# Patient Record
Sex: Female | Born: 1955
Health system: Southern US, Community
[De-identification: ages and names within clinical notes are randomized; demographics above are authoritative.]

## PROBLEM LIST (undated history)

## (undated) DIAGNOSIS — B019 Varicella without complication: Secondary | ICD-10-CM

## (undated) HISTORY — DX: Varicella without complication: B01.9

## (undated) HISTORY — PX: BUNIONECTOMY: SHX129

---

## 1999-07-02 ENCOUNTER — Other Ambulatory Visit: Admission: RE | Admit: 1999-07-02 | Discharge: 1999-07-02 | Payer: Self-pay | Admitting: Internal Medicine

## 2000-03-11 ENCOUNTER — Other Ambulatory Visit: Admission: RE | Admit: 2000-03-11 | Discharge: 2000-03-11 | Payer: Self-pay | Admitting: Podiatry

## 2001-01-16 ENCOUNTER — Encounter: Admission: RE | Admit: 2001-01-16 | Discharge: 2001-01-16 | Payer: Self-pay | Admitting: Internal Medicine

## 2001-01-16 ENCOUNTER — Encounter: Payer: Self-pay | Admitting: Internal Medicine

## 2001-12-08 ENCOUNTER — Other Ambulatory Visit: Admission: RE | Admit: 2001-12-08 | Discharge: 2001-12-08 | Payer: Self-pay | Admitting: Internal Medicine

## 2002-03-24 ENCOUNTER — Encounter: Admission: RE | Admit: 2002-03-24 | Discharge: 2002-03-24 | Payer: Self-pay | Admitting: Internal Medicine

## 2002-03-24 ENCOUNTER — Encounter: Payer: Self-pay | Admitting: Internal Medicine

## 2003-05-13 ENCOUNTER — Encounter: Payer: Self-pay | Admitting: Internal Medicine

## 2003-05-13 ENCOUNTER — Encounter: Admission: RE | Admit: 2003-05-13 | Discharge: 2003-05-13 | Payer: Self-pay | Admitting: Internal Medicine

## 2004-04-24 ENCOUNTER — Other Ambulatory Visit: Admission: RE | Admit: 2004-04-24 | Discharge: 2004-04-24 | Payer: Self-pay | Admitting: Internal Medicine

## 2004-05-18 ENCOUNTER — Encounter: Admission: RE | Admit: 2004-05-18 | Discharge: 2004-05-18 | Payer: Self-pay | Admitting: Internal Medicine

## 2005-05-09 ENCOUNTER — Ambulatory Visit: Payer: Self-pay | Admitting: Family Medicine

## 2005-05-22 ENCOUNTER — Encounter: Admission: RE | Admit: 2005-05-22 | Discharge: 2005-05-22 | Payer: Self-pay | Admitting: Internal Medicine

## 2005-05-23 ENCOUNTER — Ambulatory Visit: Payer: Self-pay | Admitting: Internal Medicine

## 2005-10-24 ENCOUNTER — Ambulatory Visit: Payer: Self-pay | Admitting: Internal Medicine

## 2006-05-30 ENCOUNTER — Encounter: Admission: RE | Admit: 2006-05-30 | Discharge: 2006-05-30 | Payer: Self-pay | Admitting: Internal Medicine

## 2006-07-17 ENCOUNTER — Ambulatory Visit: Payer: Self-pay | Admitting: Internal Medicine

## 2006-10-31 ENCOUNTER — Ambulatory Visit: Payer: Self-pay | Admitting: Internal Medicine

## 2006-12-17 ENCOUNTER — Ambulatory Visit: Payer: Self-pay | Admitting: Internal Medicine

## 2006-12-17 LAB — CONVERTED CEMR LAB
AST: 16 units/L (ref 0–37)
Albumin: 3.6 g/dL (ref 3.5–5.2)
Basophils Absolute: 0 10*3/uL (ref 0.0–0.1)
Basophils Relative: 0.1 % (ref 0.0–1.0)
Chol/HDL Ratio, serum: 2.1
Creatinine, Ser: 1.1 mg/dL (ref 0.4–1.2)
Eosinophil percent: 0.7 % (ref 0.0–5.0)
Glucose, Bld: 89 mg/dL (ref 70–99)
HCT: 43.8 % (ref 36.0–46.0)
Hemoglobin: 15.4 g/dL — ABNORMAL HIGH (ref 12.0–15.0)
LDL Cholesterol: 73 mg/dL (ref 0–99)
Lymphocytes Relative: 26.2 % (ref 12.0–46.0)
Monocytes Relative: 4.8 % (ref 3.0–11.0)
Neutrophils Relative %: 68.2 % (ref 43.0–77.0)
Platelets: 211 10*3/uL (ref 150–400)
Potassium: 4 meq/L (ref 3.5–5.1)
RDW: 11.6 % (ref 11.5–14.6)
Total Protein: 6.7 g/dL (ref 6.0–8.3)
VLDL: 26 mg/dL (ref 0–40)
WBC: 7.9 10*3/uL (ref 4.5–10.5)

## 2007-02-18 ENCOUNTER — Ambulatory Visit: Payer: Self-pay | Admitting: Internal Medicine

## 2007-02-18 ENCOUNTER — Other Ambulatory Visit: Admission: RE | Admit: 2007-02-18 | Discharge: 2007-02-18 | Payer: Self-pay | Admitting: Internal Medicine

## 2007-07-08 ENCOUNTER — Ambulatory Visit (HOSPITAL_COMMUNITY): Admission: RE | Admit: 2007-07-08 | Discharge: 2007-07-08 | Payer: Self-pay | Admitting: Internal Medicine

## 2007-09-15 ENCOUNTER — Telehealth: Payer: Self-pay | Admitting: Internal Medicine

## 2007-10-12 ENCOUNTER — Ambulatory Visit: Payer: Self-pay | Admitting: Internal Medicine

## 2008-03-08 ENCOUNTER — Telehealth: Payer: Self-pay | Admitting: Internal Medicine

## 2008-07-14 ENCOUNTER — Encounter: Payer: Self-pay | Admitting: Internal Medicine

## 2008-07-15 ENCOUNTER — Ambulatory Visit (HOSPITAL_COMMUNITY): Admission: RE | Admit: 2008-07-15 | Discharge: 2008-07-15 | Payer: Self-pay | Admitting: Internal Medicine

## 2008-09-27 ENCOUNTER — Ambulatory Visit: Payer: Self-pay | Admitting: Internal Medicine

## 2008-11-29 ENCOUNTER — Telehealth: Payer: Self-pay | Admitting: Internal Medicine

## 2008-12-20 ENCOUNTER — Ambulatory Visit: Payer: Self-pay | Admitting: Internal Medicine

## 2008-12-30 ENCOUNTER — Ambulatory Visit: Payer: Self-pay | Admitting: Internal Medicine

## 2008-12-30 LAB — HM COLONOSCOPY

## 2009-07-31 ENCOUNTER — Encounter: Admission: RE | Admit: 2009-07-31 | Discharge: 2009-07-31 | Payer: Self-pay | Admitting: Internal Medicine

## 2009-08-04 ENCOUNTER — Encounter: Payer: Self-pay | Admitting: Internal Medicine

## 2009-08-04 ENCOUNTER — Encounter: Admission: RE | Admit: 2009-08-04 | Discharge: 2009-08-04 | Payer: Self-pay | Admitting: Internal Medicine

## 2009-10-16 ENCOUNTER — Ambulatory Visit: Payer: Self-pay | Admitting: Internal Medicine

## 2010-03-23 HISTORY — PX: OTHER SURGICAL HISTORY: SHX169

## 2010-07-03 ENCOUNTER — Telehealth: Payer: Self-pay | Admitting: Internal Medicine

## 2010-08-13 ENCOUNTER — Encounter: Admission: RE | Admit: 2010-08-13 | Discharge: 2010-08-13 | Payer: Self-pay | Admitting: Internal Medicine

## 2010-08-22 ENCOUNTER — Ambulatory Visit: Payer: Self-pay | Admitting: Internal Medicine

## 2010-08-22 LAB — CONVERTED CEMR LAB
ALT: 20 units/L (ref 0–35)
Alkaline Phosphatase: 62 units/L (ref 39–117)
Bilirubin, Direct: 0.3 mg/dL (ref 0.0–0.3)
Blood in Urine, dipstick: NEGATIVE
Calcium: 9.8 mg/dL (ref 8.4–10.5)
Creatinine, Ser: 0.9 mg/dL (ref 0.4–1.2)
Eosinophils Absolute: 0 10*3/uL (ref 0.0–0.7)
Glucose, Bld: 84 mg/dL (ref 70–99)
HCT: 43.9 % (ref 36.0–46.0)
Lymphs Abs: 1.6 10*3/uL (ref 0.7–4.0)
MCV: 90.5 fL (ref 78.0–100.0)
Monocytes Absolute: 0.3 10*3/uL (ref 0.1–1.0)
Neutro Abs: 4.1 10*3/uL (ref 1.4–7.7)
Platelets: 186 10*3/uL (ref 150.0–400.0)
Protein, U semiquant: NEGATIVE
Sodium: 140 meq/L (ref 135–145)
T3, Free: 3.6 pg/mL (ref 2.3–4.2)
TSH: 0.03 microintl units/mL — ABNORMAL LOW (ref 0.35–5.50)
Total Bilirubin: 1.9 mg/dL — ABNORMAL HIGH (ref 0.3–1.2)
pH: 6

## 2010-08-29 ENCOUNTER — Other Ambulatory Visit: Admission: RE | Admit: 2010-08-29 | Discharge: 2010-08-29 | Payer: Self-pay | Admitting: Internal Medicine

## 2010-08-29 ENCOUNTER — Ambulatory Visit: Payer: Self-pay | Admitting: Internal Medicine

## 2010-08-29 DIAGNOSIS — E079 Disorder of thyroid, unspecified: Secondary | ICD-10-CM | POA: Insufficient documentation

## 2010-08-29 DIAGNOSIS — J309 Allergic rhinitis, unspecified: Secondary | ICD-10-CM | POA: Insufficient documentation

## 2010-08-29 LAB — HM PAP SMEAR

## 2010-08-30 LAB — CONVERTED CEMR LAB
Free T4: 0.86 ng/dL (ref 0.60–1.60)
TSH: 0.11 microintl units/mL — ABNORMAL LOW (ref 0.35–5.50)

## 2010-09-14 ENCOUNTER — Ambulatory Visit: Payer: Self-pay | Admitting: Endocrinology

## 2010-09-14 DIAGNOSIS — E069 Thyroiditis, unspecified: Secondary | ICD-10-CM

## 2010-09-14 DIAGNOSIS — N951 Menopausal and female climacteric states: Secondary | ICD-10-CM

## 2010-09-18 ENCOUNTER — Telehealth: Payer: Self-pay | Admitting: Endocrinology

## 2010-10-05 ENCOUNTER — Ambulatory Visit: Payer: Self-pay | Admitting: Endocrinology

## 2010-10-05 LAB — CONVERTED CEMR LAB
Free T4: 0.78 ng/dL (ref 0.60–1.60)
TSH: 2.4 microintl units/mL (ref 0.35–5.50)

## 2010-11-05 ENCOUNTER — Ambulatory Visit: Payer: Self-pay | Admitting: Endocrinology

## 2010-11-05 ENCOUNTER — Telehealth: Payer: Self-pay | Admitting: Endocrinology

## 2011-01-04 ENCOUNTER — Other Ambulatory Visit: Payer: Self-pay | Admitting: Endocrinology

## 2011-01-04 ENCOUNTER — Ambulatory Visit
Admission: RE | Admit: 2011-01-04 | Discharge: 2011-01-04 | Payer: Self-pay | Source: Home / Self Care | Attending: Endocrinology | Admitting: Endocrinology

## 2011-01-04 LAB — TSH: TSH: 1.3 u[IU]/mL (ref 0.35–5.50)

## 2011-01-14 ENCOUNTER — Encounter: Payer: Self-pay | Admitting: Internal Medicine

## 2011-01-24 NOTE — Assessment & Plan Note (Signed)
Summary: 2 month follow up-lb   Vital Signs:  Patient profile:   55 year old female Menstrual status:  perimenopausal Height:      67 inches (170.18 cm) Weight:      141.75 pounds (64.43 kg) BMI:     22.28 O2 Sat:      99 % on Room air Temp:     97.9 degrees F (36.61 degrees C) oral Pulse rate:   80 / minute BP sitting:   112 / 72  (left arm) Cuff size:   regular  Vitals Entered By: Brenton Grills CMA Duncan Dull) (January 04, 2011 11:17 AM)  O2 Flow:  Room air CC: F/U on thyroid/aj Is Patient Diabetic? No   CC:  F/U on thyroid/aj.  History of Present Illness: pt is her for f/u of subacute thyroiditis.  she reports sxs of insomnia, and intermittent excessive diaphoresis.   she would like to go off synthroid if possible.  Current Medications (verified): 1)  Ambien 10 Mg  Tabs (Zolpidem Tartrate) .Marland Kitchen.. 1 By Mouth At Bedtime 2)  Levothyroxine Sodium 50 Mcg Tabs (Levothyroxine Sodium) .Marland Kitchen.. 1 Tab Once Daily  Allergies (verified): No Known Drug Allergies  Past History:  Past Medical History: Last updated: 08/29/2010 G0P0  Allergic rhinitis seasonal summer cough  takes claritin  Varicella   Review of Systems  The patient denies fever.    Physical Exam  General:  normal appearance.   Neck:  thyroid is normal Skin:  not diaphoretic Additional Exam:   FastTSH                   1.30 uIU/mL     Impression & Recommendations:  Problem # 1:  THYROIDITIS (ICD-245.9) ? need for ongoing synthroid  Other Orders: TLB-TSH (Thyroid Stimulating Hormone) (84443-TSH) Est. Patient Level III (04540)  Patient Instructions: 1)  blood tests are being ordered for you today.  please call [phone] to hear your test results. 2)  pending the test results, please continue the same medications for now. 3)  (update: i left message on phone-tree:  stop synthroid.  go to lab in 6 weeks 245.9)   Orders Added: 1)  TLB-TSH (Thyroid Stimulating Hormone) [84443-TSH] 2)  Est. Patient Level III  [78295]

## 2011-01-24 NOTE — Miscellaneous (Signed)
Summary: Flu Vaccination/[facility]   Flu Vaccination/[facility]   Imported By: Maryln Gottron 05/17/2010 10:06:05  _____________________________________________________________________  External Attachment:    Type:   Image     Comment:   External Document

## 2011-01-24 NOTE — Assessment & Plan Note (Signed)
Summary: cpx/ssc   Vital Signs:  Patient profile:   55 year old female Menstrual status:  perimenopausal LMP:     05/07/2010 Height:      67 inches Weight:      140 pounds BMI:     22.01 Pulse rate:   78 / minute BP sitting:   100 / 60  (left arm) Cuff size:   regular  Vitals Entered By: Romualdo Bolk, CMA (AAMA) (August 29, 2010 10:53 AM) CC: CPX with pap LMP (date): 05/07/2010     Menstrual Status perimenopausal Enter LMP: 05/07/2010   History of Present Illness: Christina Wilkins comes in today for a checkup. Her last office visit with [place] was  over three years ago.  although has been here when her kids have a checkup.she has no major concerns but is due for a checkup. She has no main complaints today. She uses Ambien as needed when she travels a prescription of 30th Lasted  three years. She is no longer on birth control pills. She is menopausal and has had six months without a. And then intermittent. She has hot flushes a good bit.  No major injuries or other change in her health status  Preventive Care Screening  Last Tetanus Booster:    Date:  12/23/2006    Results:  Tdap   Prior Values:    Mammogram:  ASSESSMENT: Negative - BI-RADS 1^MM DIGITAL SCREENING (08/13/2010)    Colonoscopy:  Location:  [facility].   (12/30/2008)   Preventive Screening-Counseling & Management  Alcohol-Tobacco     Alcohol drinks/day: <1     Alcohol type: beer     Smoking Status: never  Caffeine-Diet-Exercise     Caffeine use/day: less than 1 a day     Does Patient Exercise: yes     Times/week: 4  Hep-HIV-STD-Contraception     Dental Visit-last 6 months yes     Sun Exposure-Excessive: no  Safety-Violence-Falls     Seat Belt Use: yes     Firearms in the Home: no firearms in the home     Smoke Detectors: yes      Drug Use:  no.        Blood Transfusions:  no.    Current Medications (verified): 1)  Ambien 10 Mg  Tabs (Zolpidem Tartrate) .Marland Kitchen.. 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 Alcohol use-no Drug use-no Regular exercise-yes HH of 3  two adopted childrenSmoking Status:  never Caffeine use/day:  less than 1 a day Does Patient Exercise:  yes Drug Use:  no Seat Belt Use:  yes Dental Care w/in 6 mos.:  yes Sun Exposure-Excessive:  no Blood Transfusions:  no  Review of Systems  The patient denies anorexia, fever, weight loss, weight gain, vision loss, decreased hearing, hoarseness, chest pain, syncope, dyspnea on exertion, peripheral edema, prolonged cough, headaches, hemoptysis, abdominal pain, melena, hematochezia, severe indigestion/heartburn, hematuria, incontinence, muscle weakness, suspicious skin lesions, depression, abnormal bleeding, enlarged lymph nodes, angioedema, and breast masses.    Physical Exam  General:  Well-developed,well-nourished,in no  acute distress; alert,appropriate and cooperative throughout examination Head:  normocephalic and atraumatic.   Eyes:  PERRL, EOMs full, conjunctiva clear  Ears:  R ear normal, L ear normal, and no external deformities.   Nose:  no external deformity, no external erythema, and no nasal discharge.   Mouth:  good dentition and pharynx pink and moist.   Neck:  No deformities, masses, or tenderness noted. i dont feel thyroid  Chest Wall:  No deformities, masses, or tenderness noted. Breasts:  No mass, nodules, thickening, tenderness, bulging, retraction, inflamation, nipple discharge or skin changes noted.   Lungs:   Normal respiratory effort, chest expands symmetrically. Lungs are clear to auscultation, no crackles or wheezes.no dullness.   Heart:  Normal rate and regular rhythm. S1 and S2 normal without gallop, murmur, click, rub or other extra sounds.no lifts.   Abdomen:  Bowel sounds positive,abdomen soft and non-tender without masses, organomegaly or hernias noted. Rectal:  No external abnormalities noted. Normal sphincter tone. No rectal masses or tenderness. Genitalia:  Pelvic Exam:        External: normal female genitalia without lesions or masses        Vagina: normal without lesions or masses        Cervix: normal without lesions or masses        Adnexa: normal bimanual exam without masses or fullness        Uterus: normal by palpation        Pap smear: performed Msk:  left foot  small cystic soft lesion  left foot. dorsum non tender ( area of injury ) normal appearing function and range of motion afoot Pulses:  R and L carotid,radial,femoral,dorsalis pedis and posterior tibial pulses are full and equal bilaterally Extremities:  no clubbing cyanosis or edema  Neurologic:  alert & oriented X3, cranial nerves II-XII intact, strength normal in all extremities, gait normal, and DTRs symmetrical and normal.   Skin:  turgor normal, color normal, and no purpura.  sun changes  Cervical Nodes:  No lymphadenopathy noted Axillary Nodes:  No palpable lymphadenopathy Inguinal Nodes:  No significant adenopathy Psych:  Oriented X3, normally interactive, good eye contact, not anxious appearing, and not depressed appearing.   laboratory studies reviewed with patient. TSH decreased  Impression & Recommendations:  Problem # 1:  PREVENTIVE HEALTH CARE (ICD-V70.0) continue healthy lifestyle   Problem # 2:  ROUTINE GYNECOLOGICAL EXAM (ICD-V72.31)  normal exam and Pap done  Orders: Pap Smear, Thin Prep ( Collection of) (Z6109)  Problem # 3:  THYROID STIMULATING HORMONE, ABNORMAL (ICD-246.9) seemingly no  symptoms related to this. Discussed options will repeat this today and if confirmed consistent with subclinical hyperthyroidism and will plan endocrine consult. She will probably need to scan etc. discussed with her. Orders: TLB-TSH (Thyroid Stimulating Hormone) (84443-TSH) TLB-T4 (Thyrox), Free [id number]) Venipuncture (19147) Specimen Handling (82956)  Problem # 4:  ALLERGIC RHINITIS (ICD-477.9) as needed over the counter medication  Problem # 5:  situational sleep disorder can call for a refill of her Ambien is needed.  Problem # 6:  perimenopausal state counseled  Complete Medication List: 1)  Ambien 10 Mg Tabs (Zolpidem tartrate) .Marland Kitchen.. 1 by mouth at bedtime  Other Orders: Admin 1st Vaccine (21308) Flu Vaccine 57yrs + (65784)  Patient Instructions: 1)  You will be informed of lab results when available.  2)  if confirmed abnormal will do endocrine consult.    Flu Vaccine Consent Questions     Do you have a history of severe  allergic reactions to this vaccine? no    Any prior history of allergic reactions to egg and/or gelatin? no    Do you have a sensitivity to the preservative Thimersol? no    Do you have a past history of Guillan-Barre Syndrome? no    Do you currently have an acute febrile illness? no    Have you ever had a severe reaction to latex? no    Vaccine information given and explained to patient? yes    Are you currently pregnant? no    Lot Number:[id number]   Exp Date:06/22/2011   Site Given  Left Deltoid IMlu Romualdo Bolk, CMA (AAMA)  August 29, 2010 10:59 AM

## 2011-01-24 NOTE — Assessment & Plan Note (Signed)
Summary: f/u appt/#/cd   Vital Signs:  Patient profile:   55 year old female Menstrual status:  perimenopausal Height:      67 inches (170.18 cm) Weight:      145.50 pounds (66.14 kg) BMI:     22.87 O2 Sat:      97 % on Room air Temp:     97.7 degrees F (36.50 degrees C) oral Pulse rate:   76 / minute BP sitting:   118 / 72  (left arm) Cuff size:   regular  Vitals Entered By: Brenton Grills MA (October 05, 2010 3:52 PM)  O2 Flow:  Room air CC: Follow-up visit/aj Is Patient Diabetic? No   CC:  Follow-up visit/aj.  History of Present Illness: pt was recently found to have subacute thyroiditis.  she was started on synthroid when tsh was high-normal.  on synthroid, she feels no different.    Current Medications (verified): 1)  Ambien 10 Mg  Tabs (Zolpidem Tartrate) .Marland Kitchen.. 1 By Mouth At Bedtime 2)  Levothyroxine Sodium 50 Mcg Tabs (Levothyroxine Sodium) .Marland Kitchen.. 1 Tab Once Daily  Allergies (verified): No Known Drug Allergies  Past History:  Past Medical History: Last updated: 08/29/2010 G0P0  Allergic rhinitis seasonal summer cough  takes claritin  Varicella   Review of Systems  The patient denies fever.    Physical Exam  General:  normal appearance.   Neck:  thyroid is normal Additional Exam:  FastTSH                   2.40 uIU/mL                 0.35-5.50 Free T4                   0.78 ng/dL                  0.60-1.60    Impression & Recommendations:  Problem # 1:  THYROIDITIS (ICD-245.9) now euthyroid, on synthroid  Other Orders: TLB-TSH (Thyroid Stimulating Hormone) (84443-TSH) TLB-T4 (Thyrox), Free [phone]) Est. Patient Level III (19147)  Patient Instructions: 1)  blood tests are being ordered for you today.  please call [phone] to hear your test results. 2)  pending the test results, please continue the same medications for now. 3)  tentative plan will be to adjust synthroid based on the result, and go back to lab in 1 month for recheck of tsh  245.9.   Orders Added: 1)  TLB-TSH (Thyroid Stimulating Hormone) [84443-TSH] 2)  TLB-T4 (Thyrox), Free [30865-HQ4O] 3)  Est. Patient Level III [96295]

## 2011-01-24 NOTE — Progress Notes (Signed)
Summary: refill on ambien  Phone Note Call from Patient   Caller: Patient Summary of Call: Pt would like a rx called in for Ambien.  Initial call taken by: Romualdo Bolk, CMA (AAMA),  July 03, 2010 2:06 PM  Follow-up for Phone Call        Per Dr. Fabian Sharp- Okay to do #30 with 1 refill. Rx called in. Follow-up by: Romualdo Bolk, CMA (AAMA),  July 03, 2010 2:10 PM    New/Updated Medications: AMBIEN 10 MG  TABS (ZOLPIDEM TARTRATE) 1 by mouth at bedtime Prescriptions: AMBIEN 10 MG  TABS (ZOLPIDEM TARTRATE) 1 by mouth at bedtime  #30 x 1   Entered by:   Romualdo Bolk, CMA (AAMA)   Authorized by:   Madelin Headings MD   Signed by:   Romualdo Bolk, CMA (AAMA) on 07/03/2010   Method used:   Telephoned to ...       [facility] [address] (retail)       [address]       [place], [place]  [address]       Ph: [phone] or [phone]       Fax: [phone]   RxID:   [id number]

## 2011-01-24 NOTE — Assessment & Plan Note (Signed)
Summary: new/uhc/low tsh/panosh/cd   Vital Signs:  Patient profile:   55 year old female Menstrual status:  perimenopausal Height:      67 inches (170.18 cm) Weight:      142 pounds (64.55 kg) BMI:     22.32 O2 Sat:      95 % on Room air Temp:     97.4 degrees F (36.33 degrees C) oral Pulse rate:   65 / minute BP sitting:   108 / 70  (left arm) Cuff size:   regular  Vitals Entered By: Brenton Grills MA (September 14, 2010 8:42 AM)  O2 Flow:  Room air CC: New Endo/Low TSH/Dr. Panosh/aj   CC:  New Endo/Low TSH/Dr. Panosh/aj.  History of Present Illness: pt was noted on recent routine labs to have suppressed tsh.  pt states she feels well in general.  she has few weeks of mild "hot flashes" of the skin, and slight assoc diaphoresis.  Current Medications (verified): 1)  Ambien 10 Mg  Tabs (Zolpidem Tartrate) .Marland Kitchen.. 1 By Mouth At Bedtime  Allergies (verified): No Known Drug Allergies  Past History:  Past Medical History: Last updated: 08/29/2010 G0P0  Allergic rhinitis seasonal summer cough  takes claritin  Varicella   Family History: Reviewed history from 08/29/2010 and no changes required. 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   Physical Exam  General:  normal appearance.   Head:  head: no deformity eyes: no periorbital swelling, no proptosis external nose and ears are normal mouth: no lesion seen Neck:  thyroid is not enlarged.  there is a slightly irreg surface Lungs:  Clear to  auscultation bilaterally. Normal respiratory effort.  Heart:  Regular rate and rhythm without murmurs or gallops noted. Normal S1,S2.   Msk:  muscle bulk and strength are grossly normal.  no obvious joint swelling.  gait is normal and steady  Pulses:  dorsalis pedis intact bilat.   Extremities:  no deformity.  no ulcer on the feet.  feet are of normal color and temp.  no edema  Neurologic:  cn 2-12 grossly intact.   readily moves all 4's.   sensation is intact to touch on the feet  Skin:  normal texture and temp.  no rash.  not diaphoretic  Cervical Nodes:  No significant adenopathy.  Psych:  Alert and cooperative; normal mood and affect; normal attention span and concentration.   Additional Exam:  outside test results are reviewed:  2007: Thyroid Stimulating Hormone (TSH)  1.31   2011:  FastTSH              [L]  0.11 uIU/mL        Impression & Recommendations:  Problem # 1:  THYROIDITIS (ICD-245.9) subacute  Problem # 2:  THYROID STIMULATING HORMONE, ABNORMAL (ICD-246.9) she is entering the hypothyroid phase now  Problem # 3:  MENOPAUSAL SYNDROME (ICD-627.2) mild, but this could limit interpretation of her sxs  Medications Added to Medication List This Visit: 1)  Levothyroxine Sodium 50 Mcg Tabs (Levothyroxine sodium) .Marland Kitchen.. 1 tab once daily  Other Orders: TLB-TSH (Thyroid Stimulating Hormone) (84443-TSH) TLB-T4 (  Thyrox), Free [phone]) Consultation Level IV [phone])  Patient Instructions: 1)  recheck thyroid blood tests today. 2)  please call [phone] to hear your test results. 3)  if still high, let's a thyroid nuclear med scan 4)  if lower, we would conclude that you have subacute (viral) thyroiditis, which will go through a low-thyroid-blood-test phase before recovery. 5)  (update: i left message on phone-tree:  of the above possibilities, this is the latter one.  start synthroid 50/day.  ret 3 weeks). Prescriptions: LEVOTHYROXINE SODIUM 50 MCG TABS (LEVOTHYROXINE  SODIUM) 1 tab once daily  #30 x 1   Entered and Authorized by:   Minus Breeding MD   Signed by:   Minus Breeding MD on 09/14/2010   Method used:   [facility]ronically to        [phone] (retail)       [address]       [place], [place]  [address]       Ph: [phone] or [phone]       Fax: [phone]   RxID:   [id number]    Preventive Care Screening  Pap Smear:    Date:  08/23/2010    Results:  normal   Mammogram:    Date:  07/23/2010    Results:  normal

## 2011-01-24 NOTE — Progress Notes (Signed)
  Phone Note Call from Patient Call back at Work Phone [phone]   Caller: Patient Call For: ellison Summary of Call: Pt states her lab results are not on phone tree and also wants clarifiation on prescription?  Initial call taken by: Verdell Face,  September 18, 2010 3:41 PM  Follow-up for Phone Call        I checked PT and message was not available for this pt, please advise Follow-up by: Margaret Pyle, CMA,  September 18, 2010 3:57 PM  Additional Follow-up for Phone Call Additional follow up Details #1::        i called pt and explained.  she agrees. Additional Follow-up by: Minus Breeding MD,  September 18, 2010 5:17 PM

## 2011-01-24 NOTE — Progress Notes (Signed)
Summary: med question/refill  Phone Note Call from Patient Call back at Home Phone [phone]   Caller: Patient Reason for Call: Talk to Nurse Summary of Call: Wants to know if she needs to still be on  Levothyroxine Sodium 50 Mcg Tabs, if so [facility] [address], pt did do her labs today Initial call taken by: Migdalia Dk,  November 05, 2010 10:06 AM  Follow-up for Phone Call        i'll send rx based on the resuts of today's blood test results, because i don't want to send the wrong strength.  we'll know today. Follow-up by: Minus Breeding MD,  November 05, 2010 10:40 AM  Additional Follow-up for Phone Call Additional follow up Details #1::        Pt adivsed via VM, told to check pharmacy later today or tomorrow for Rx Additional Follow-up by: Margaret Pyle, CMA,  November 05, 2010 11:11 AM

## 2011-02-18 ENCOUNTER — Other Ambulatory Visit: Payer: Self-pay

## 2011-02-18 ENCOUNTER — Telehealth: Payer: Self-pay | Admitting: Endocrinology

## 2011-02-22 ENCOUNTER — Other Ambulatory Visit: Payer: 59

## 2011-02-22 ENCOUNTER — Encounter (INDEPENDENT_AMBULATORY_CARE_PROVIDER_SITE_OTHER): Payer: Self-pay | Admitting: *Deleted

## 2011-02-22 ENCOUNTER — Other Ambulatory Visit: Payer: Self-pay | Admitting: Endocrinology

## 2011-02-22 DIAGNOSIS — E069 Thyroiditis, unspecified: Secondary | ICD-10-CM

## 2011-02-28 NOTE — Progress Notes (Signed)
Summary: MED QUESTION  Phone Note Call from Patient Call back at Home Phone [phone]   Caller: Patient Reason for Call: Talk to Nurse Summary of Call: PT WANTS TO TALK ABOUT GOING BACK ON SYNTHROID Initial call taken by: Migdalia Dk,  February 18, 2011 9:47 AM  Follow-up for Phone Call        i would be happy to consider that.  please redo the blood test, though.  i wll be happy to leave message on phone-tree about the result. Follow-up by: Minus Breeding MD,  February 18, 2011 9:52 AM  Additional Follow-up for Phone Call Additional follow up Details #1::        pt did not do blood test today due to 45 minute wait in the lab, Migdalia Dk  February 18, 2011 10:05 AM   Pt stated that she will come back to the lab Friday  Additional Follow-up by: Margaret Pyle, CMA,  February 18, 2011 3:29 PM

## 2011-07-12 ENCOUNTER — Other Ambulatory Visit: Payer: Self-pay | Admitting: Internal Medicine

## 2011-07-12 DIAGNOSIS — Z1231 Encounter for screening mammogram for malignant neoplasm of breast: Secondary | ICD-10-CM

## 2011-08-19 ENCOUNTER — Ambulatory Visit
Admission: RE | Admit: 2011-08-19 | Discharge: 2011-08-19 | Disposition: A | Discharge status: Home / Self Care | Payer: 59 | Source: Ambulatory Visit | Attending: Internal Medicine | Admitting: Internal Medicine

## 2011-08-19 DIAGNOSIS — Z1231 Encounter for screening mammogram for malignant neoplasm of breast: Secondary | ICD-10-CM

## 2011-10-18 ENCOUNTER — Ambulatory Visit: Payer: 59

## 2011-10-18 ENCOUNTER — Ambulatory Visit (INDEPENDENT_AMBULATORY_CARE_PROVIDER_SITE_OTHER): Payer: 59

## 2011-10-18 DIAGNOSIS — Z23 Encounter for immunization: Secondary | ICD-10-CM

## 2012-03-11 ENCOUNTER — Telehealth: Payer: Self-pay | Admitting: *Deleted

## 2012-03-11 DIAGNOSIS — E069 Thyroiditis, unspecified: Secondary | ICD-10-CM

## 2012-03-11 DIAGNOSIS — E079 Disorder of thyroid, unspecified: Secondary | ICD-10-CM

## 2012-03-11 DIAGNOSIS — Z0000 Encounter for general adult medical examination without abnormal findings: Secondary | ICD-10-CM

## 2012-03-11 NOTE — Telephone Encounter (Signed)
Yes   tsh Free t3 free t4  Thyroid antibodies   Then OV with results   Would also advise  to do a full panel  CMP and cbc and lipids  To be complete  Before she comes  As i dont see these checked recently .

## 2012-03-11 NOTE — Telephone Encounter (Signed)
Pt aware and will call back to schedule the lab appt and rov

## 2012-03-11 NOTE — Telephone Encounter (Signed)
Pt emailed and wanted to know if it - would it be possible to have a thyroid test run again - I am experiencing the same symptoms as last year - constant temp fluctuations, sleep disturbances - same thing as when I had the viral thryoiditis.  My questions is this - can I go ahead and have the lab drawn and THEN come in for an appt or do I have to come in first, then return again?

## 2012-03-24 ENCOUNTER — Other Ambulatory Visit (INDEPENDENT_AMBULATORY_CARE_PROVIDER_SITE_OTHER): Payer: 59

## 2012-03-24 DIAGNOSIS — Z0000 Encounter for general adult medical examination without abnormal findings: Secondary | ICD-10-CM

## 2012-03-24 DIAGNOSIS — E079 Disorder of thyroid, unspecified: Secondary | ICD-10-CM

## 2012-03-24 DIAGNOSIS — E069 Thyroiditis, unspecified: Secondary | ICD-10-CM

## 2012-03-24 LAB — COMPREHENSIVE METABOLIC PANEL
Albumin: 4.1 g/dL (ref 3.5–5.2)
Alkaline Phosphatase: 58 U/L (ref 39–117)
BUN: 17 mg/dL (ref 6–23)
Calcium: 9.4 mg/dL (ref 8.4–10.5)
Creatinine, Ser: 1 mg/dL (ref 0.4–1.2)
GFR: 65.2 mL/min (ref >60.00)
Glucose, Bld: 89 mg/dL (ref 70–99)
Total Protein: 6.8 g/dL (ref 6.0–8.3)

## 2012-03-24 LAB — CBC WITH DIFFERENTIAL/PLATELET
Eosinophils Relative: 1.3 % (ref 0.0–5.0)
Lymphocytes Relative: 39.2 % (ref 12.0–46.0)
Lymphs Abs: 1.5 10*3/uL (ref 0.7–4.0)
MCHC: 33.5 g/dL (ref 30.0–36.0)
Monocytes Absolute: 0.2 10*3/uL (ref 0.1–1.0)
Neutro Abs: 2.1 10*3/uL (ref 1.4–7.7)
Platelets: 155 10*3/uL (ref 150.0–400.0)
WBC: 3.9 10*3/uL — ABNORMAL LOW (ref 4.5–10.5)

## 2012-03-24 LAB — LIPID PANEL: Triglycerides: 45 mg/dL (ref 0.0–149.0)

## 2012-03-24 LAB — TSH: TSH: 2.33 u[IU]/mL (ref 0.35–5.50)

## 2012-03-25 LAB — THYROID ANTIBODIES
Thyroglobulin Ab: <20 U/mL (ref <40.0)
Thyroperoxidase Ab SerPl-aCnc: <10 IU/mL (ref <35.0)

## 2012-04-07 ENCOUNTER — Encounter: Payer: Self-pay | Admitting: Internal Medicine

## 2012-04-07 ENCOUNTER — Ambulatory Visit (INDEPENDENT_AMBULATORY_CARE_PROVIDER_SITE_OTHER): Payer: 59 | Admitting: Internal Medicine

## 2012-04-07 VITALS — BP 120/80 | HR 99 | Temp 98.3°F | Wt 141.5 lb

## 2012-04-07 DIAGNOSIS — Z8669 Personal history of other diseases of the nervous system and sense organs: Secondary | ICD-10-CM

## 2012-04-07 DIAGNOSIS — R232 Flushing: Secondary | ICD-10-CM

## 2012-04-07 DIAGNOSIS — N951 Menopausal and female climacteric states: Secondary | ICD-10-CM

## 2012-04-07 DIAGNOSIS — Z87898 Personal history of other specified conditions: Secondary | ICD-10-CM

## 2012-04-07 NOTE — Progress Notes (Signed)
  Subjective:    Patient ID: Christina Wilkins, female    DOB: [birth year], 56 y.o.   MRN: [id number]  HPI Pt comesin today for labs and fu of some sx. And thyroid  Check She is very well but has been having increase of Hot flushes  Some at night  Taking  otc  meds .   Not a lot of help . No cp sob or other palpitations exercises regularly and no change .   Review of Systems ROS:  GEN/ HEENT: No fever, significant weight changes sweats headaches vision problems hearing changes, CV/ PULM; No chest pain shortness of breath cough, syncope,edema  change in exercise tolerance. GI /GU: No adominal pain, vomiting, change in bowel habits. No blood in the stool. No significant GU symptoms. SKIN/HEME: ,no acute skin rashes suspicious lesions or bleeding. No lymphadenopathy, nodules, masses.  NEURO/ PSYCH:  No neurologic signs such as weakness numbness. No depression anxiety. IMM/ Allergy: No unusual infections.  Allergy .   REST of 12 system review negative except as per HPI \ Past history family history social history reviewed in the electronic medical record.     Objective:   Physical Exam BP 120/80  Pulse 99  Temp(Src) 98.3 F (36.8 C) (Oral)  Wt 141 lb 8 oz (64.184 kg)  SpO2 99% HEENT: Normocephalic ;atraumatic , Eyes;  PERRL, EOMs  Full, lids and conjunctiva clear,,Ears: no deformities, canals nl, TM landmarks normal, Nose: no deformity or discharge  Mouth : OP clear without lesion or edema . 'Neck: Supple without adenopathy or masses or bruits Chest:  Clear to A&P without wheezes rales or rhonchi CV:  S1-S2 no gallops or murmurs peripheral perfusion is normal Abdomen:  Sof,t normal bowel sounds without hepatosplenomegaly, no guarding rebound or masses no CVA tenderness No clubbing cyanosis or edema   Lab Results  Component Value Date   WBC 3.9* 03/24/2012   HGB 14.2 03/24/2012   HCT 42.4 03/24/2012   PLT 155.0 03/24/2012   GLUCOSE 89 03/24/2012   CHOL 186 03/24/2012   TRIG 45.0 03/24/2012   HDL  99.80 03/24/2012   LDLCALC 77 03/24/2012   ALT 17 03/24/2012   AST 18 03/24/2012   NA 140 03/24/2012   K 4.3 03/24/2012   CL 103 03/24/2012   CREATININE 1.0 03/24/2012   BUN 17 03/24/2012   CO2 29 03/24/2012   TSH 2.33 03/24/2012       Assessment & Plan:   Flushes probably from  Perimenopause    Hx of thyroiditis    Nl labs today.  Sleep intermittent issue meds as needed  Follow  meds as needed . Check yearly or as needed UTD on HCM

## 2012-04-07 NOTE — Patient Instructions (Addendum)
Your labs are good.    I agree your sx are menopausal.

## 2012-04-12 DIAGNOSIS — R232 Flushing: Secondary | ICD-10-CM | POA: Insufficient documentation

## 2012-04-12 DIAGNOSIS — Z87898 Personal history of other specified conditions: Secondary | ICD-10-CM | POA: Insufficient documentation

## 2012-05-22 ENCOUNTER — Other Ambulatory Visit: Payer: Self-pay

## 2012-05-22 MED ORDER — ZOLPIDEM TARTRATE 10 MG PO TABS: 10.0000 mg | ORAL_TABLET | Freq: Every evening | ORAL | Status: DC | PRN

## 2012-05-22 NOTE — Telephone Encounter (Signed)
Ok to refill 30 x 2  

## 2012-05-22 NOTE — Telephone Encounter (Signed)
Rx sent to pharmacy   

## 2012-05-22 NOTE — Telephone Encounter (Signed)
Rx request for zolpidem tartrate 10 mg.  Pt last seen 04/07/12.  Pls advise.

## 2012-08-10 ENCOUNTER — Other Ambulatory Visit: Payer: Self-pay | Admitting: Internal Medicine

## 2012-08-10 DIAGNOSIS — Z1231 Encounter for screening mammogram for malignant neoplasm of breast: Secondary | ICD-10-CM

## 2012-08-25 ENCOUNTER — Ambulatory Visit: Payer: 59

## 2012-09-02 ENCOUNTER — Ambulatory Visit: Payer: 59

## 2012-09-07 ENCOUNTER — Ambulatory Visit
Admission: RE | Admit: 2012-09-07 | Discharge: 2012-09-07 | Disposition: A | Discharge status: Home / Self Care | Payer: 59 | Source: Ambulatory Visit | Attending: Internal Medicine | Admitting: Internal Medicine

## 2012-09-07 DIAGNOSIS — Z1231 Encounter for screening mammogram for malignant neoplasm of breast: Secondary | ICD-10-CM

## 2012-09-09 ENCOUNTER — Other Ambulatory Visit: Payer: Self-pay | Admitting: Internal Medicine

## 2012-09-09 DIAGNOSIS — R928 Other abnormal and inconclusive findings on diagnostic imaging of breast: Secondary | ICD-10-CM

## 2012-09-14 ENCOUNTER — Ambulatory Visit
Admission: RE | Admit: 2012-09-14 | Discharge: 2012-09-14 | Disposition: A | Discharge status: Home / Self Care | Payer: 59 | Source: Ambulatory Visit | Attending: Internal Medicine | Admitting: Internal Medicine

## 2012-09-14 DIAGNOSIS — R928 Other abnormal and inconclusive findings on diagnostic imaging of breast: Secondary | ICD-10-CM

## 2012-09-30 ENCOUNTER — Ambulatory Visit (INDEPENDENT_AMBULATORY_CARE_PROVIDER_SITE_OTHER): Payer: 59 | Admitting: Family Medicine

## 2012-09-30 DIAGNOSIS — Z23 Encounter for immunization: Secondary | ICD-10-CM

## 2012-11-04 ENCOUNTER — Other Ambulatory Visit: Payer: Self-pay | Admitting: Internal Medicine

## 2012-11-04 ENCOUNTER — Other Ambulatory Visit: Payer: Self-pay | Admitting: Family Medicine

## 2012-11-04 MED ORDER — CITALOPRAM HYDROBROMIDE 20 MG PO TABS: ORAL_TABLET | ORAL | Status: DC

## 2012-12-21 ENCOUNTER — Other Ambulatory Visit: Payer: Self-pay | Admitting: Internal Medicine

## 2013-03-01 ENCOUNTER — Telehealth: Payer: Self-pay | Admitting: Internal Medicine

## 2013-03-01 NOTE — Telephone Encounter (Signed)
Pt said her insurance will pay for a 90 day supply of  citalopram (CELEXA) 20 MG tablet. Ptwas given a  30 day. Would like to know if you could  call  [facility] [place]?

## 2013-03-02 ENCOUNTER — Other Ambulatory Visit: Payer: Self-pay | Admitting: Family Medicine

## 2013-03-02 NOTE — Telephone Encounter (Signed)
Left message on personally identified cell that the pt will need to call and make an appt with [facility] to receive a 90 day supply.  Has been right at a year since she was last seen.

## 2013-03-24 ENCOUNTER — Ambulatory Visit (INDEPENDENT_AMBULATORY_CARE_PROVIDER_SITE_OTHER): Payer: 59 | Admitting: Family Medicine

## 2013-03-24 DIAGNOSIS — Z111 Encounter for screening for respiratory tuberculosis: Secondary | ICD-10-CM

## 2013-04-06 ENCOUNTER — Encounter: Payer: Self-pay | Admitting: Internal Medicine

## 2013-04-06 ENCOUNTER — Ambulatory Visit (INDEPENDENT_AMBULATORY_CARE_PROVIDER_SITE_OTHER): Payer: 59 | Admitting: Internal Medicine

## 2013-04-06 VITALS — BP 106/78 | HR 79 | Temp 98.1°F | Wt 142.0 lb

## 2013-04-06 DIAGNOSIS — Z79899 Other long term (current) drug therapy: Secondary | ICD-10-CM | POA: Insufficient documentation

## 2013-04-06 DIAGNOSIS — N959 Unspecified menopausal and perimenopausal disorder: Secondary | ICD-10-CM

## 2013-04-06 DIAGNOSIS — Z0000 Encounter for general adult medical examination without abnormal findings: Secondary | ICD-10-CM

## 2013-04-06 DIAGNOSIS — Z9229 Personal history of other drug therapy: Secondary | ICD-10-CM

## 2013-04-06 LAB — TB SKIN TEST: TB Skin Test: NEGATIVE

## 2013-04-06 MED ORDER — CITALOPRAM HYDROBROMIDE 20 MG PO TABS: 20.0000 mg | ORAL_TABLET | Freq: Every day | ORAL | Status: DC

## 2013-04-06 NOTE — Patient Instructions (Signed)
contact us for refills as needed if need 90 days  Sign up  For my  Chart  Yearly check  For meds or as needed.  Preventive Care for Adults, Female A healthy lifestyle and preventive care can promote health and wellness. Preventive health guidelines for women include the following key practices.  A routine yearly physical is a good way to check with your caregiver about your health and preventive screening. It is a chance to share any concerns and updates on your health, and to receive a thorough exam.  Visit your dentist for a routine exam and preventive care every 6 months. Brush your teeth twice a day and floss once a day. Good oral hygiene prevents tooth decay and gum disease.  The frequency of eye exams is based on your age, health, family medical history, use of contact lenses, and other factors. Follow your caregiver's recommendations for frequency of eye exams.  Eat a healthy diet. Foods like vegetables, fruits, whole grains, low-fat dairy products, and lean protein foods contain the nutrients you need without too many calories. Decrease your intake of foods high in solid fats, added sugars, and salt. Eat the right amount of calories for you.Get information about a proper diet from your caregiver, if necessary.  Regular physical exercise is one of the most important things you can do for your health. Most adults should get at least 150 minutes of moderate-intensity exercise (any activity that increases your heart rate and causes you to sweat) each week. In addition, most adults need muscle-strengthening exercises on 2 or more days a week.  Maintain a healthy weight. The body mass index (BMI) is a screening tool to identify possible weight problems. It provides an estimate of body fat based on height and weight. Your caregiver can help determine your BMI, and can help you achieve or maintain a healthy weight.For adults 20 years and older:  A BMI below 18.5 is considered underweight.  A  BMI of 18.5 to 24.9 is normal.  A BMI of 25 to 29.9 is considered overweight.  A BMI of 30 and above is considered obese.  Maintain normal blood lipids and cholesterol levels by exercising and minimizing your intake of saturated fat. Eat a balanced diet with plenty of fruit and vegetables. Blood tests for lipids and cholesterol should begin at age 21 and be repeated every 5 years. If your lipid or cholesterol levels are high, you are over 50, or you are at high risk for heart disease, you may need your cholesterol levels checked more frequently.Ongoing high lipid and cholesterol levels should be treated with medicines if diet and exercise are not effective.  If you smoke, find out from your caregiver how to quit. If you do not use tobacco, do not start.  If you are pregnant, do not drink alcohol. If you are breastfeeding, be very cautious about drinking alcohol. If you are not pregnant and choose to drink alcohol, do not exceed 1 drink per day. One drink is considered to be 12 ounces (355 mL) of beer, 5 ounces (148 mL) of wine, or 1.5 ounces (44 mL) of liquor.  Avoid use of street drugs. Do not share needles with anyone. Ask for help if you need support or instructions about stopping the use of drugs.  High blood pressure causes heart disease and increases the risk of stroke. Your blood pressure should be checked at least every 1 to 2 years. Ongoing high blood pressure should be treated with medicines if weight  loss and exercise are not effective.  If you are 20 to 55 years old, ask your caregiver if you should take aspirin to prevent strokes.  Diabetes screening involves taking a blood sample to check your fasting blood sugar level. This should be done once every 3 years, after age 63, if you are within normal weight and without risk factors for diabetes. Testing should be considered at a younger age or be carried out more frequently if you are overweight and have at least 1 risk factor for  diabetes.  Breast cancer screening is essential preventive care for women. You should practice "breast self-awareness." This means understanding the normal appearance and feel of your breasts and may include breast self-examination. Any changes detected, no matter how small, should be reported to a caregiver. Women in their 19s and 30s should have a clinical breast exam (CBE) by a caregiver as part of a regular health exam every 1 to 3 years. After age 51, women should have a CBE every year. Starting at age 26, women should consider having a mammography (breast X-ray test) every year. Women who have a family history of breast cancer should talk to their caregiver about genetic screening. Women at a high risk of breast cancer should talk to their caregivers about having magnetic resonance imaging (MRI) and a mammography every year.  The Pap test is a screening test for cervical cancer. A Pap test can show cell changes on the cervix that might become cervical cancer if left untreated. A Pap test is a procedure in which cells are obtained and examined from the lower end of the uterus (cervix).  Women should have a Pap test starting at age 4.  Between ages 76 and 63, Pap tests should be repeated every 2 years.  Beginning at age 5, you should have a Pap test every 3 years as long as the past 3 Pap tests have been normal.  Some women have medical problems that increase the chance of getting cervical cancer. Talk to your caregiver about these problems. It is especially important to talk to your caregiver if a new problem develops soon after your last Pap test. In these cases, your caregiver may recommend more frequent screening and Pap tests.  The above recommendations are the same for women who have or have not gotten the vaccine for human papillomavirus (HPV).  If you had a hysterectomy for a problem that was not cancer or a condition that could lead to cancer, then you no longer need Pap tests. Even if  you no longer need a Pap test, a regular exam is a good idea to make sure no other problems are starting.  If you are between ages 67 and 84, and you have had normal Pap tests going back 10 years, you no longer need Pap tests. Even if you no longer need a Pap test, a regular exam is a good idea to make sure no other problems are starting.  If you have had past treatment for cervical cancer or a condition that could lead to cancer, you need Pap tests and screening for cancer for at least 20 years after your treatment.  If Pap tests have been discontinued, risk factors (such as a new sexual partner) need to be reassessed to determine if screening should be resumed.  The HPV test is an additional test that may be used for cervical cancer screening. The HPV test looks for the virus that can cause the cell changes on the cervix.  The cells collected during the Pap test can be tested for HPV. The HPV test could be used to screen women aged 40 years and older, and should be used in women of any age who have unclear Pap test results. After the age of 25, women should have HPV testing at the same frequency as a Pap test.  Colorectal cancer can be detected and often prevented. Most routine colorectal cancer screening begins at the age of 3 and continues through age 23. However, your caregiver may recommend screening at an earlier age if you have risk factors for colon cancer. On a yearly basis, your caregiver may provide home test kits to check for hidden blood in the stool. Use of a small camera at the end of a tube, to directly examine the colon (sigmoidoscopy or colonoscopy), can detect the earliest forms of colorectal cancer. Talk to your caregiver about this at age 29, when routine screening begins. Direct examination of the colon should be repeated every 5 to 10 years through age 70, unless early forms of pre-cancerous polyps or small growths are found.  Hepatitis C blood testing is recommended for all  people born from [birth year] through 1965 and any individual with known risks for hepatitis C.  Practice safe sex. Use condoms and avoid high-risk sexual practices to reduce the spread of sexually transmitted infections (STIs). STIs include gonorrhea, chlamydia, syphilis, trichomonas, herpes, HPV, and human immunodeficiency virus (HIV). Herpes, HIV, and HPV are viral illnesses that have no cure. They can result in disability, cancer, and death. Sexually active women aged 49 and younger should be checked for chlamydia. Older women with new or multiple partners should also be tested for chlamydia. Testing for other STIs is recommended if you are sexually active and at increased risk.  Osteoporosis is a disease in which the bones lose minerals and strength with aging. This can result in serious bone fractures. The risk of osteoporosis can be identified using a bone density scan. Women ages 34 and over and women at risk for fractures or osteoporosis should discuss screening with their caregivers. Ask your caregiver whether you should take a calcium supplement or vitamin D to reduce the rate of osteoporosis.  Menopause can be associated with physical symptoms and risks. Hormone replacement therapy is available to decrease symptoms and risks. You should talk to your caregiver about whether hormone replacement therapy is right for you.  Use sunscreen with sun protection factor (SPF) of 30 or more. Apply sunscreen liberally and repeatedly throughout the day. You should seek shade when your shadow is shorter than you. Protect yourself by wearing long sleeves, pants, a wide-brimmed hat, and sunglasses year round, whenever you are outdoors.  Once a month, do a whole body skin exam, using a mirror to look at the skin on your back. Notify your caregiver of new moles, moles that have irregular borders, moles that are larger than a pencil eraser, or moles that have changed in shape or color.  Stay current with required  immunizations.  Influenza. You need a dose every fall (or winter). The composition of the flu vaccine changes each year, so being vaccinated once is not enough.  Pneumococcal polysaccharide. You need 1 to 2 doses if you smoke cigarettes or if you have certain chronic medical conditions. You need 1 dose at age 20 (or older) if you have never been vaccinated.  Tetanus, diphtheria, pertussis (Tdap, Td). Get 1 dose of Tdap vaccine if you are younger than age 99, are over  65 and have contact with an infant, are a Research scientist (physical sciences), are pregnant, or simply want to be protected from whooping cough. After that, you need a Td booster dose every 10 years. Consult your caregiver if you have not had at least 3 tetanus and diphtheria-containing shots sometime in your life or have a deep or dirty wound.  HPV. You need this vaccine if you are a woman age 67 or younger. The vaccine is given in 3 doses over 6 months.  Measles, mumps, rubella (MMR). You need at least 1 dose of MMR if you were born in 1957 or later. You may also need a second dose.  Meningococcal. If you are age 92 to 46 and a first-year college student living in a residence hall, or have one of several medical conditions, you need to get vaccinated against meningococcal disease. You may also need additional booster doses.  Zoster (shingles). If you are age 24 or older, you should get this vaccine.  Varicella (chickenpox). If you have never had chickenpox or you were vaccinated but received only 1 dose, talk to your caregiver to find out if you need this vaccine.  Hepatitis A. You need this vaccine if you have a specific risk factor for hepatitis A virus infection or you simply wish to be protected from this disease. The vaccine is usually given as 2 doses, 6 to 18 months apart.  Hepatitis B. You need this vaccine if you have a specific risk factor for hepatitis B virus infection or you simply wish to be protected from this disease. The vaccine is  given in 3 doses, usually over 6 months. Preventive Services / Frequency Ages 35 to 31  Blood pressure check.** / Every 1 to 2 years.  Lipid and cholesterol check.** / Every 5 years beginning at age 42.  Clinical breast exam.** / Every 3 years for women in their 84s and 30s.  Pap test.** / Every 2 years from ages 48 through 67. Every 3 years starting at age 18 through age 85 or 28 with a history of 3 consecutive normal Pap tests.  HPV screening.** / Every 3 years from ages 36 through ages 41 to 44 with a history of 3 consecutive normal Pap tests.  Hepatitis C blood test.** / For any individual with known risks for hepatitis C.  Skin self-exam. / Monthly.  Influenza immunization.** / Every year.  Pneumococcal polysaccharide immunization.** / 1 to 2 doses if you smoke cigarettes or if you have certain chronic medical conditions.  Tetanus, diphtheria, pertussis (Tdap, Td) immunization. / A one-time dose of Tdap vaccine. After that, you need a Td booster dose every 10 years.  HPV immunization. / 3 doses over 6 months, if you are 21 and younger.  Measles, mumps, rubella (MMR) immunization. / You need at least 1 dose of MMR if you were born in 1957 or later. You may also need a second dose.  Meningococcal immunization. / 1 dose if you are age 81 to 48 and a first-year college student living in a residence hall, or have one of several medical conditions, you need to get vaccinated against meningococcal disease. You may also need additional booster doses.  Varicella immunization.** / Consult your caregiver.  Hepatitis A immunization.** / Consult your caregiver. 2 doses, 6 to 18 months apart.  Hepatitis B immunization.** / Consult your caregiver. 3 doses usually over 6 months. Ages 19 to 51  Blood pressure check.** / Every 1 to 2 years.  Lipid and cholesterol  check.** / Every 5 years beginning at age 23.  Clinical breast exam.** / Every year after age 59.  Mammogram.** / Every year  beginning at age 7 and continuing for as long as you are in good health. Consult with your caregiver.  Pap test.** / Every 3 years starting at age 33 through age 18 or 81 with a history of 3 consecutive normal Pap tests.  HPV screening.** / Every 3 years from ages 58 through ages 15 to 62 with a history of 3 consecutive normal Pap tests.  Fecal occult blood test (FOBT) of stool. / Every year beginning at age 36 and continuing until age 67. You may not need to do this test if you get a colonoscopy every 10 years.  Flexible sigmoidoscopy or colonoscopy.** / Every 5 years for a flexible sigmoidoscopy or every 10 years for a colonoscopy beginning at age 67 and continuing until age 56.  Hepatitis C blood test.** / For all people born from [birth year] through 1963 and any individual with known risks for hepatitis C.  Skin self-exam. / Monthly.  Influenza immunization.** / Every year.  Pneumococcal polysaccharide immunization.** / 1 to 2 doses if you smoke cigarettes or if you have certain chronic medical conditions.  Tetanus, diphtheria, pertussis (Tdap, Td) immunization.** / A one-time dose of Tdap vaccine. After that, you need a Td booster dose every 10 years.  Measles, mumps, rubella (MMR) immunization. / You need at least 1 dose of MMR if you were born in 1957 or later. You may also need a second dose.  Varicella immunization.** / Consult your caregiver.  Meningococcal immunization.** / Consult your caregiver.  Hepatitis A immunization.** / Consult your caregiver. 2 doses, 6 to 18 months apart.  Hepatitis B immunization.** / Consult your caregiver. 3 doses, usually over 6 months. Ages 13 and over  Blood pressure check.** / Every 1 to 2 years.  Lipid and cholesterol check.** / Every 5 years beginning at age 19.  Clinical breast exam.** / Every year after age 33.  Mammogram.** / Every year beginning at age 65 and continuing for as long as you are in good health. Consult with your  caregiver.  Pap test.** / Every 3 years starting at age 44 through age 20 or 47 with a 3 consecutive normal Pap tests. Testing can be stopped between 65 and 70 with 3 consecutive normal Pap tests and no abnormal Pap or HPV tests in the past 10 years.  HPV screening.** / Every 3 years from ages 20 through ages 55 or 52 with a history of 3 consecutive normal Pap tests. Testing can be stopped between 65 and 70 with 3 consecutive normal Pap tests and no abnormal Pap or HPV tests in the past 10 years.  Fecal occult blood test (FOBT) of stool. / Every year beginning at age 19 and continuing until age 34. You may not need to do this test if you get a colonoscopy every 10 years.  Flexible sigmoidoscopy or colonoscopy.** / Every 5 years for a flexible sigmoidoscopy or every 10 years for a colonoscopy beginning at age 64 and continuing until age 50.  Hepatitis C blood test.** / For all people born from [birth year] through 1963 and any individual with known risks for hepatitis C.  Osteoporosis screening.** / A one-time screening for women ages 63 and over and women at risk for fractures or osteoporosis.  Skin self-exam. / Monthly.  Influenza immunization.** / Every year.  Pneumococcal polysaccharide immunization.** / 1 dose at  age 62 (or older) if you have never been vaccinated.  Tetanus, diphtheria, pertussis (Tdap, Td) immunization. / A one-time dose of Tdap vaccine if you are over 65 and have contact with an infant, are a Research scientist (physical sciences), or simply want to be protected from whooping cough. After that, you need a Td booster dose every 10 years.  Varicella immunization.** / Consult your caregiver.  Meningococcal immunization.** / Consult your caregiver.  Hepatitis A immunization.** / Consult your caregiver. 2 doses, 6 to 18 months apart.  Hepatitis B immunization.** / Check with your caregiver. 3 doses, usually over 6 months. ** Family history and personal history of risk and conditions may change your  caregiver's recommendations. Document Released: 02/04/2002 Document Revised: 03/02/2012 Document Reviewed: 05/06/2011 [facility] Patient Information 2013 [place], [place].

## 2013-04-06 NOTE — Progress Notes (Signed)
Chief Complaint  Patient presents with  . Follow-up    Meds    HPI: Patient comes in today for follow up of medical issues .  Yearly visit  doing quite well taking citalopram for hot flashes and sleep menopausal she stopped up for little bit and noticed that it was worse when back on an as seems to be doing well at this time.  Would like refills is a 90 day instead of the 30 day period Uses Ambien for sleep only when traveling still has some left on her refill. No new symptoms cardiovascular respiratory GI.  She needs to be certified for [employer] and needs documentation of her flu vaccine and PPD. PPD was placed a week or so ago and there was 0 induration but she forgot to come in to get it checked. No cough or respiratory symptoms. ROS: See pertinent positives and negatives per HPI.  Past Medical History  Diagnosis Date  . Allergic rhinitis     seasonal summer cough takes claritin  . Varicella     Family History  Problem Relation Age of Onset  . Cancer Mother     breast; 2009  . Hypothyroidism Mother   . Alcohol abuse Father   . Hypertension Father     History   Social History  . Marital Status: Single    Spouse Name: N/A    Number of Children: N/A  . Years of Education: N/A   Social History Main Topics  . Smoking status: Never Smoker   . Smokeless tobacco: Never Used  . Alcohol Use: Yes     Comment: per pt a beer once a week   . Drug Use: None  . Sexually Active: None   Other Topics Concern  . None   Social History Narrative   Single pharmacist   Regular Exercise- yes   hh of 3   Two adopted children     Outpatient Encounter Prescriptions as of 04/06/2013  Medication Sig Dispense Refill  . calcium carbonate (OS-CAL) 600 MG TABS Take 600 mg by mouth 2 (two) times daily with a meal.      . citalopram (CELEXA) 20 MG tablet TAKE 1/2 TABLET EVERY DAY FOR 1 WEEK THEN INCREASE TO 1 TABLET AS DIRECTED  30 tablet  3  . glucosamine-chondroitin 500-400 MG tablet Take  1 tablet by mouth 3 (three) times daily.      Marland Kitchen loratadine (CLARITIN) 10 MG tablet Take 10 mg by mouth daily.      . Multiple Vitamin (MULTIVITAMIN) tablet Take 1 tablet by mouth daily.      Marland Kitchen zolpidem (AMBIEN) 10 MG tablet Take 1 tablet (10 mg total) by mouth at bedtime as needed.  30 tablet  2  . [DISCONTINUED] Specialty Vitamins Products (MENOPAUSE SUPPORT PO) Take by mouth.       No facility-administered encounter medications on file as of 04/06/2013.    EXAM:  BP 106/78  Pulse 79  Temp(Src) 98.1 F (36.7 C) (Oral)  Wt 142 lb (64.411 kg)  BMI 22.24 kg/m2  SpO2 99%  Body mass index is 22.24 kg/(m^2).  GENERAL: vitals reviewed and listed above, alert, oriented, appears well hydrated and in no acute distress  HEENT: atraumatic, conjunctiva  clear, no obvious abnormalities on inspection of external nose and ears OP : no lesion edema or exudate   NECK: no obvious masses on inspection palpation   LUNGS: clear to auscultation bilaterally, no wheezes, rales or rhonchi, good air movement  CV:  HRRR, no clubbing cyanosis or  peripheral edema nl cap refill  Skin arms show no scarring or erythema residual of PPD MS: moves all extremities without noticeable focal  abnormality  PSYCH: pleasant and cooperative, no obvious depression or anxiety  ASSESSMENT AND PLAN:  Discussed the following assessment and plan:  Menopausal and perimenopausal disorder - Doing much better on citalopram  Medication management  Immunizations up to date - reveiwed  and  printed and signed. Her last tetanus booster was a T. Dap although the EHR says Td.  Can return yearly or every 6 month depending on what preventive measure is due. She is probably due for a Pap smear in the next year. Contact us for refills. -Patient advised to return or notify health care team  if symptoms worsen or persist or new concerns arise.  Patient Instructions  contact us for refills as needed if need 90 days  Sign up  For my   Chart  Yearly check  For meds or as needed.  Preventive Care for Adults, Female A healthy lifestyle and preventive care can promote health and wellness. Preventive health guidelines for women include the following key practices.  A routine yearly physical is a good way to check with your caregiver about your health and preventive screening. It is a chance to share any concerns and updates on your health, and to receive a thorough exam.  Visit your dentist for a routine exam and preventive care every 6 months. Brush your teeth twice a day and floss once a day. Good oral hygiene prevents tooth decay and gum disease.  The frequency of eye exams is based on your age, health, family medical history, use of contact lenses, and other factors. Follow your caregiver's recommendations for frequency of eye exams.  Eat a healthy diet. Foods like vegetables, fruits, whole grains, low-fat dairy products, and lean protein foods contain the nutrients you need without too many calories. Decrease your intake of foods high in solid fats, added sugars, and salt. Eat the right amount of calories for you.Get information about a proper diet from your caregiver, if necessary.  Regular physical exercise is one of the most important things you can do for your health. Most adults should get at least 150 minutes of moderate-intensity exercise (any activity that increases your heart rate and causes you to sweat) each week. In addition, most adults need muscle-strengthening exercises on 2 or more days a week.  Maintain a healthy weight. The body mass index (BMI) is a screening tool to identify possible weight problems. It provides an estimate of body fat based on height and weight. Your caregiver can help determine your BMI, and can help you achieve or maintain a healthy weight.For adults 20 years and older:  A BMI below 18.5 is considered underweight.  A BMI of 18.5 to 24.9 is normal.  A BMI of 25 to 29.9 is considered  overweight.  A BMI of 30 and above is considered obese.  Maintain normal blood lipids and cholesterol levels by exercising and minimizing your intake of saturated fat. Eat a balanced diet with plenty of fruit and vegetables. Blood tests for lipids and cholesterol should begin at age 54 and be repeated every 5 years. If your lipid or cholesterol levels are high, you are over 50, or you are at high risk for heart disease, you may need your cholesterol levels checked more frequently.Ongoing high lipid and cholesterol levels should be treated with medicines if diet and exercise are not  effective.  If you smoke, find out from your caregiver how to quit. If you do not use tobacco, do not start.  If you are pregnant, do not drink alcohol. If you are breastfeeding, be very cautious about drinking alcohol. If you are not pregnant and choose to drink alcohol, do not exceed 1 drink per day. One drink is considered to be 12 ounces (355 mL) of beer, 5 ounces (148 mL) of wine, or 1.5 ounces (44 mL) of liquor.  Avoid use of street drugs. Do not share needles with anyone. Ask for help if you need support or instructions about stopping the use of drugs.  High blood pressure causes heart disease and increases the risk of stroke. Your blood pressure should be checked at least every 1 to 2 years. Ongoing high blood pressure should be treated with medicines if weight loss and exercise are not effective.  If you are 53 to 55 years old, ask your caregiver if you should take aspirin to prevent strokes.  Diabetes screening involves taking a blood sample to check your fasting blood sugar level. This should be done once every 3 years, after age 68, if you are within normal weight and without risk factors for diabetes. Testing should be considered at a younger age or be carried out more frequently if you are overweight and have at least 1 risk factor for diabetes.  Breast cancer screening is essential preventive care for  women. You should practice "breast self-awareness." This means understanding the normal appearance and feel of your breasts and may include breast self-examination. Any changes detected, no matter how small, should be reported to a caregiver. Women in their 27s and 30s should have a clinical breast exam (CBE) by a caregiver as part of a regular health exam every 1 to 3 years. After age 18, women should have a CBE every year. Starting at age 70, women should consider having a mammography (breast X-ray test) every year. Women who have a family history of breast cancer should talk to their caregiver about genetic screening. Women at a high risk of breast cancer should talk to their caregivers about having magnetic resonance imaging (MRI) and a mammography every year.  The Pap test is a screening test for cervical cancer. A Pap test can show cell changes on the cervix that might become cervical cancer if left untreated. A Pap test is a procedure in which cells are obtained and examined from the lower end of the uterus (cervix).  Women should have a Pap test starting at age 61.  Between ages 43 and 80, Pap tests should be repeated every 2 years.  Beginning at age 28, you should have a Pap test every 3 years as long as the past 3 Pap tests have been normal.  Some women have medical problems that increase the chance of getting cervical cancer. Talk to your caregiver about these problems. It is especially important to talk to your caregiver if a new problem develops soon after your last Pap test. In these cases, your caregiver may recommend more frequent screening and Pap tests.  The above recommendations are the same for women who have or have not gotten the vaccine for human papillomavirus (HPV).  If you had a hysterectomy for a problem that was not cancer or a condition that could lead to cancer, then you no longer need Pap tests. Even if you no longer need a Pap test, a regular exam is a good idea to make  sure  no other problems are starting.  If you are between ages 77 and 105, and you have had normal Pap tests going back 10 years, you no longer need Pap tests. Even if you no longer need a Pap test, a regular exam is a good idea to make sure no other problems are starting.  If you have had past treatment for cervical cancer or a condition that could lead to cancer, you need Pap tests and screening for cancer for at least 20 years after your treatment.  If Pap tests have been discontinued, risk factors (such as a new sexual partner) need to be reassessed to determine if screening should be resumed.  The HPV test is an additional test that may be used for cervical cancer screening. The HPV test looks for the virus that can cause the cell changes on the cervix. The cells collected during the Pap test can be tested for HPV. The HPV test could be used to screen women aged 22 years and older, and should be used in women of any age who have unclear Pap test results. After the age of 51, women should have HPV testing at the same frequency as a Pap test.  Colorectal cancer can be detected and often prevented. Most routine colorectal cancer screening begins at the age of 34 and continues through age 69. However, your caregiver may recommend screening at an earlier age if you have risk factors for colon cancer. On a yearly basis, your caregiver may provide home test kits to check for hidden blood in the stool. Use of a small camera at the end of a tube, to directly examine the colon (sigmoidoscopy or colonoscopy), can detect the earliest forms of colorectal cancer. Talk to your caregiver about this at age 74, when routine screening begins. Direct examination of the colon should be repeated every 5 to 10 years through age 55, unless early forms of pre-cancerous polyps or small growths are found.  Hepatitis C blood testing is recommended for all people born from [birth year] through 1965 and any individual with known risks  for hepatitis C.  Practice safe sex. Use condoms and avoid high-risk sexual practices to reduce the spread of sexually transmitted infections (STIs). STIs include gonorrhea, chlamydia, syphilis, trichomonas, herpes, HPV, and human immunodeficiency virus (HIV). Herpes, HIV, and HPV are viral illnesses that have no cure. They can result in disability, cancer, and death. Sexually active women aged 74 and younger should be checked for chlamydia. Older women with new or multiple partners should also be tested for chlamydia. Testing for other STIs is recommended if you are sexually active and at increased risk.  Osteoporosis is a disease in which the bones lose minerals and strength with aging. This can result in serious bone fractures. The risk of osteoporosis can be identified using a bone density scan. Women ages 46 and over and women at risk for fractures or osteoporosis should discuss screening with their caregivers. Ask your caregiver whether you should take a calcium supplement or vitamin D to reduce the rate of osteoporosis.  Menopause can be associated with physical symptoms and risks. Hormone replacement therapy is available to decrease symptoms and risks. You should talk to your caregiver about whether hormone replacement therapy is right for you.  Use sunscreen with sun protection factor (SPF) of 30 or more. Apply sunscreen liberally and repeatedly throughout the day. You should seek shade when your shadow is shorter than you. Protect yourself by wearing long sleeves, pants, a wide-brimmed hat,  and sunglasses year round, whenever you are outdoors.  Once a month, do a whole body skin exam, using a mirror to look at the skin on your back. Notify your caregiver of new moles, moles that have irregular borders, moles that are larger than a pencil eraser, or moles that have changed in shape or color.  Stay current with required immunizations.  Influenza. You need a dose every fall (or winter). The  composition of the flu vaccine changes each year, so being vaccinated once is not enough.  Pneumococcal polysaccharide. You need 1 to 2 doses if you smoke cigarettes or if you have certain chronic medical conditions. You need 1 dose at age 58 (or older) if you have never been vaccinated.  Tetanus, diphtheria, pertussis (Tdap, Td). Get 1 dose of Tdap vaccine if you are younger than age 32, are over 17 and have contact with an infant, are a Research scientist (physical sciences), are pregnant, or simply want to be protected from whooping cough. After that, you need a Td booster dose every 10 years. Consult your caregiver if you have not had at least 3 tetanus and diphtheria-containing shots sometime in your life or have a deep or dirty wound.  HPV. You need this vaccine if you are a woman age 66 or younger. The vaccine is given in 3 doses over 6 months.  Measles, mumps, rubella (MMR). You need at least 1 dose of MMR if you were born in 1957 or later. You may also need a second dose.  Meningococcal. If you are age 5 to 55 and a first-year college student living in a residence hall, or have one of several medical conditions, you need to get vaccinated against meningococcal disease. You may also need additional booster doses.  Zoster (shingles). If you are age 64 or older, you should get this vaccine.  Varicella (chickenpox). If you have never had chickenpox or you were vaccinated but received only 1 dose, talk to your caregiver to find out if you need this vaccine.  Hepatitis A. You need this vaccine if you have a specific risk factor for hepatitis A virus infection or you simply wish to be protected from this disease. The vaccine is usually given as 2 doses, 6 to 18 months apart.  Hepatitis B. You need this vaccine if you have a specific risk factor for hepatitis B virus infection or you simply wish to be protected from this disease. The vaccine is given in 3 doses, usually over 6 months. Preventive Services /  Frequency Ages 45 to 63  Blood pressure check.** / Every 1 to 2 years.  Lipid and cholesterol check.** / Every 5 years beginning at age 39.  Clinical breast exam.** / Every 3 years for women in their 1s and 30s.  Pap test.** / Every 2 years from ages 79 through 69. Every 3 years starting at age 22 through age 20 or 79 with a history of 3 consecutive normal Pap tests.  HPV screening.** / Every 3 years from ages 35 through ages 48 to 37 with a history of 3 consecutive normal Pap tests.  Hepatitis C blood test.** / For any individual with known risks for hepatitis C.  Skin self-exam. / Monthly.  Influenza immunization.** / Every year.  Pneumococcal polysaccharide immunization.** / 1 to 2 doses if you smoke cigarettes or if you have certain chronic medical conditions.  Tetanus, diphtheria, pertussis (Tdap, Td) immunization. / A one-time dose of Tdap vaccine. After that, you need a Td booster dose  every 10 years.  HPV immunization. / 3 doses over 6 months, if you are 47 and younger.  Measles, mumps, rubella (MMR) immunization. / You need at least 1 dose of MMR if you were born in 1957 or later. You may also need a second dose.  Meningococcal immunization. / 1 dose if you are age 33 to 74 and a first-year college student living in a residence hall, or have one of several medical conditions, you need to get vaccinated against meningococcal disease. You may also need additional booster doses.  Varicella immunization.** / Consult your caregiver.  Hepatitis A immunization.** / Consult your caregiver. 2 doses, 6 to 18 months apart.  Hepatitis B immunization.** / Consult your caregiver. 3 doses usually over 6 months. Ages 60 to 8  Blood pressure check.** / Every 1 to 2 years.  Lipid and cholesterol check.** / Every 5 years beginning at age 70.  Clinical breast exam.** / Every year after age 9.  Mammogram.** / Every year beginning at age 67 and continuing for as long as you are in  good health. Consult with your caregiver.  Pap test.** / Every 3 years starting at age 65 through age 16 or 24 with a history of 3 consecutive normal Pap tests.  HPV screening.** / Every 3 years from ages 5 through ages 54 to 82 with a history of 3 consecutive normal Pap tests.  Fecal occult blood test (FOBT) of stool. / Every year beginning at age 48 and continuing until age 47. You may not need to do this test if you get a colonoscopy every 10 years.  Flexible sigmoidoscopy or colonoscopy.** / Every 5 years for a flexible sigmoidoscopy or every 10 years for a colonoscopy beginning at age 63 and continuing until age 40.  Hepatitis C blood test.** / For all people born from [birth year] through 1963 and any individual with known risks for hepatitis C.  Skin self-exam. / Monthly.  Influenza immunization.** / Every year.  Pneumococcal polysaccharide immunization.** / 1 to 2 doses if you smoke cigarettes or if you have certain chronic medical conditions.  Tetanus, diphtheria, pertussis (Tdap, Td) immunization.** / A one-time dose of Tdap vaccine. After that, you need a Td booster dose every 10 years.  Measles, mumps, rubella (MMR) immunization. / You need at least 1 dose of MMR if you were born in 1957 or later. You may also need a second dose.  Varicella immunization.** / Consult your caregiver.  Meningococcal immunization.** / Consult your caregiver.  Hepatitis A immunization.** / Consult your caregiver. 2 doses, 6 to 18 months apart.  Hepatitis B immunization.** / Consult your caregiver. 3 doses, usually over 6 months. Ages 3 and over  Blood pressure check.** / Every 1 to 2 years.  Lipid and cholesterol check.** / Every 5 years beginning at age 39.  Clinical breast exam.** / Every year after age 74.  Mammogram.** / Every year beginning at age 31 and continuing for as long as you are in good health. Consult with your caregiver.  Pap test.** / Every 3 years starting at age 90 through  age 51 or 27 with a 3 consecutive normal Pap tests. Testing can be stopped between 65 and 70 with 3 consecutive normal Pap tests and no abnormal Pap or HPV tests in the past 10 years.  HPV screening.** / Every 3 years from ages 59 through ages 61 or 51 with a history of 3 consecutive normal Pap tests. Testing can be stopped between 65 and  70 with 3 consecutive normal Pap tests and no abnormal Pap or HPV tests in the past 10 years.  Fecal occult blood test (FOBT) of stool. / Every year beginning at age 81 and continuing until age 107. You may not need to do this test if you get a colonoscopy every 10 years.  Flexible sigmoidoscopy or colonoscopy.** / Every 5 years for a flexible sigmoidoscopy or every 10 years for a colonoscopy beginning at age 3 and continuing until age 21.  Hepatitis C blood test.** / For all people born from [birth year] through 1963 and any individual with known risks for hepatitis C.  Osteoporosis screening.** / A one-time screening for women ages 75 and over and women at risk for fractures or osteoporosis.  Skin self-exam. / Monthly.  Influenza immunization.** / Every year.  Pneumococcal polysaccharide immunization.** / 1 dose at age 47 (or older) if you have never been vaccinated.  Tetanus, diphtheria, pertussis (Tdap, Td) immunization. / A one-time dose of Tdap vaccine if you are over 65 and have contact with an infant, are a Research scientist (physical sciences), or simply want to be protected from whooping cough. After that, you need a Td booster dose every 10 years.  Varicella immunization.** / Consult your caregiver.  Meningococcal immunization.** / Consult your caregiver.  Hepatitis A immunization.** / Consult your caregiver. 2 doses, 6 to 18 months apart.  Hepatitis B immunization.** / Check with your caregiver. 3 doses, usually over 6 months. ** Family history and personal history of risk and conditions may change your caregiver's recommendations. Document Released: 02/04/2002 Document  Revised: 03/02/2012 Document Reviewed: 05/06/2011 [employer] Patient Information 2013 [place], [place].         Neta Mends. Burle Kwan M.D.

## 2013-05-05 ENCOUNTER — Other Ambulatory Visit: Payer: Self-pay | Admitting: Internal Medicine

## 2013-08-10 ENCOUNTER — Other Ambulatory Visit: Payer: Self-pay

## 2013-08-10 DIAGNOSIS — Z1231 Encounter for screening mammogram for malignant neoplasm of breast: Secondary | ICD-10-CM

## 2013-09-17 ENCOUNTER — Ambulatory Visit
Admission: RE | Admit: 2013-09-17 | Discharge: 2013-09-17 | Disposition: A | Discharge status: Home / Self Care | Payer: 59 | Source: Ambulatory Visit

## 2013-09-17 DIAGNOSIS — Z1231 Encounter for screening mammogram for malignant neoplasm of breast: Secondary | ICD-10-CM

## 2013-09-20 ENCOUNTER — Ambulatory Visit (INDEPENDENT_AMBULATORY_CARE_PROVIDER_SITE_OTHER): Payer: 59 | Admitting: Family Medicine

## 2013-09-20 DIAGNOSIS — Z23 Encounter for immunization: Secondary | ICD-10-CM

## 2013-09-21 ENCOUNTER — Ambulatory Visit: Payer: 59 | Admitting: Internal Medicine

## 2013-11-05 ENCOUNTER — Other Ambulatory Visit: Payer: Self-pay | Admitting: Family Medicine

## 2013-11-05 ENCOUNTER — Other Ambulatory Visit: Payer: Self-pay | Admitting: Internal Medicine

## 2013-11-05 MED ORDER — CITALOPRAM HYDROBROMIDE 20 MG PO TABS: 20.0000 mg | ORAL_TABLET | Freq: Every day | ORAL | Status: DC

## 2013-11-26 ENCOUNTER — Encounter: Payer: Self-pay | Admitting: *Deleted

## 2013-12-01 ENCOUNTER — Encounter: Payer: Self-pay | Admitting: Internal Medicine

## 2014-05-02 ENCOUNTER — Ambulatory Visit (INDEPENDENT_AMBULATORY_CARE_PROVIDER_SITE_OTHER): Payer: 59 | Admitting: Internal Medicine

## 2014-05-02 ENCOUNTER — Encounter: Payer: Self-pay | Admitting: Internal Medicine

## 2014-05-02 VITALS — BP 114/74 | Temp 97.9°F | Ht 67.5 in | Wt 148.0 lb

## 2014-05-02 DIAGNOSIS — M25519 Pain in unspecified shoulder: Secondary | ICD-10-CM

## 2014-05-02 NOTE — Patient Instructions (Signed)
Considerations shoulder tendinitis   Vs other.nerve pain.  antiinflammatory .  2.5 aleve twice a day  Consider steroid injection and or oral  Course if tendinitis. Consider seeing sports medicine.  conact us     Impingement Syndrome, Rotator Cuff, Bursitis with Rehab Impingement syndrome is a condition that involves inflammation of the tendons of the rotator cuff and the subacromial bursa, that causes pain in the shoulder. The rotator cuff consists of four tendons and muscles that control much of the shoulder and upper arm function. The subacromial bursa is a fluid filled sac that helps reduce friction between the rotator cuff and one of the bones of the shoulder (acromion). Impingement syndrome is usually an overuse injury that causes swelling of the bursa (bursitis), swelling of the tendon (tendonitis), and/or a tear of the tendon (strain). Strains are classified into three categories. Grade 1 strains cause pain, but the tendon is not lengthened. Grade 2 strains include a lengthened ligament, due to the ligament being stretched or partially ruptured. With grade 2 strains there is still function, although the function may be decreased. Grade 3 strains include a complete tear of the tendon or muscle, and function is usually impaired. SYMPTOMS   Pain around the shoulder, often at the outer portion of the upper arm.  Pain that gets worse with shoulder function, especially when reaching overhead or lifting.  Sometimes, aching when not using the arm.  Pain that wakes you up at night.  Sometimes, tenderness, swelling, warmth, or redness over the affected area.  Loss of strength.  Limited motion of the shoulder, especially reaching behind the back (to the back pocket or to unhook bra) or across your body.  Crackling sound (crepitation) when moving the arm.  Biceps tendon pain and inflammation (in the front of the shoulder). Worse when bending the elbow or lifting. CAUSES  Impingement  syndrome is often an overuse injury, in which chronic (repetitive) motions cause the tendons or bursa to become inflamed. A strain occurs when a force is paced on the tendon or muscle that is greater than it can withstand. Common mechanisms of injury include: Stress from sudden increase in duration, frequency, or intensity of training.  Direct hit (trauma) to the shoulder.  Aging, erosion of the tendon with normal use.  Bony bump on shoulder (acromial spur). RISK INCREASES WITH:  Contact sports (football, wrestling, boxing).  Throwing sports (baseball, tennis, volleyball).  Weightlifting and bodybuilding.  Heavy labor.  Previous injury to the rotator cuff, including impingement.  Poor shoulder strength and flexibility.  Failure to warm up properly before activity.  Inadequate protective equipment.  Old age.  Bony bump on shoulder (acromial spur). PREVENTION   Warm up and stretch properly before activity.  Allow for adequate recovery between workouts.  Maintain physical fitness:  Strength, flexibility, and endurance.  Cardiovascular fitness.  Learn and use proper exercise technique. PROGNOSIS  If treated properly, impingement syndrome usually goes away within 6 weeks. Sometimes surgery is required.  RELATED COMPLICATIONS   Longer healing time if not properly treated, or if not given enough time to heal.  Recurring symptoms, that result in a chronic condition.  Shoulder stiffness, frozen shoulder, or loss of motion.  Rotator cuff tendon tear.  Recurring symptoms, especially if activity is resumed too soon, with overuse, with a direct blow, or when using poor technique. TREATMENT  Treatment first involves the use of ice and medicine, to reduce pain and inflammation. The use of strengthening and stretching exercises may help reduce  pain with activity. These exercises may be performed at home or with a therapist. If non-surgical treatment is unsuccessful after more  than 6 months, surgery may be advised. After surgery and rehabilitation, activity is usually possible in 3 months.  MEDICATION  If pain medicine is needed, nonsteroidal anti-inflammatory medicines (aspirin and ibuprofen), or other minor pain relievers (acetaminophen), are often advised.  Do not take pain medicine for 7 days before surgery.  Prescription pain relievers may be given, if your caregiver thinks they are needed. Use only as directed and only as much as you need.  Corticosteroid injections may be given by your caregiver. These injections should be reserved for the most serious cases, because they may only be given a certain number of times. HEAT AND COLD  Cold treatment (icing) should be applied for 10 to 15 minutes every 2 to 3 hours for inflammation and pain, and immediately after activity that aggravates your symptoms. Use ice packs or an ice massage.  Heat treatment may be used before performing stretching and strengthening activities prescribed by your caregiver, physical therapist, or athletic trainer. Use a heat pack or a warm water soak. SEEK MEDICAL CARE IF:   Symptoms get worse or do not improve in 4 to 6 weeks, despite treatment.  New, unexplained symptoms develop. (Drugs used in treatment may produce side effects.) EXERCISES  RANGE OF MOTION (ROM) AND STRETCHING EXERCISES - Impingement Syndrome (Rotator Cuff  Tendinitis, Bursitis) These exercises may help you when beginning to rehabilitate your injury. Your symptoms may go away with or without further involvement from your physician, physical therapist or athletic trainer. While completing these exercises, remember:   Restoring tissue flexibility helps normal motion to return to the joints. This allows healthier, less painful movement and activity.  An effective stretch should be held for at least 30 seconds.  A stretch should never be painful. You should only feel a gentle lengthening or release in the stretched  tissue. STRETCH  Flexion, Standing  Stand with good posture. With an underhand grip on your right / left hand, and an overhand grip on the opposite hand, grasp a broomstick or cane so that your hands are a little more than shoulder width apart.  Keeping your right / left elbow straight and shoulder muscles relaxed, push the stick with your opposite hand, to raise your right / left arm in front of your body and then overhead. Raise your arm until you feel a stretch in your right / left shoulder, but before you have increased shoulder pain.  Try to avoid shrugging your right / left shoulder as your arm rises, by keeping your shoulder blade tucked down and toward your mid-back spine. Hold for __________ seconds.  Slowly return to the starting position. Repeat __________ times. Complete this exercise __________ times per day. STRETCH  Abduction, Supine  Lie on your back. With an underhand grip on your right / left hand and an overhand grip on the opposite hand, grasp a broomstick or cane so that your hands are a little more than shoulder width apart.  Keeping your right / left elbow straight and your shoulder muscles relaxed, push the stick with your opposite hand, to raise your right / left arm out to the side of your body and then overhead. Raise your arm until you feel a stretch in your right / left shoulder, but before you have increased shoulder pain.  Try to avoid shrugging your right / left shoulder as your arm rises, by keeping  your shoulder blade tucked down and toward your mid-back spine. Hold for __________ seconds.  Slowly return to the starting position. Repeat __________ times. Complete this exercise __________ times per day. ROM  Flexion, Active-Assisted  Lie on your back. You may bend your knees for comfort.  Grasp a broomstick or cane so your hands are about shoulder width apart. Your right / left hand should grip the end of the stick, so that your hand is positioned  "thumbs-up," as if you were about to shake hands.  Using your healthy arm to lead, raise your right / left arm overhead, until you feel a gentle stretch in your shoulder. Hold for __________ seconds.  Use the stick to assist in returning your right / left arm to its starting position. Repeat __________ times. Complete this exercise __________ times per day.  ROM - Internal Rotation, Supine   Lie on your back on a firm surface. Place your right / left elbow about 60 degrees away from your side. Elevate your elbow with a folded towel, so that the elbow and shoulder are the same height.  Using a broomstick or cane and your strong arm, pull your right / left hand toward your body until you feel a gentle stretch, but no increase in your shoulder pain. Keep your shoulder and elbow in place throughout the exercise.  Hold for __________ seconds. Slowly return to the starting position. Repeat __________ times. Complete this exercise __________ times per day. STRETCH - Internal Rotation  Place your right / left hand behind your back, palm up.  Throw a towel or belt over your opposite shoulder. Grasp the towel with your right / left hand.  While keeping an upright posture, gently pull up on the towel, until you feel a stretch in the front of your right / left shoulder.  Avoid shrugging your right / left shoulder as your arm rises, by keeping your shoulder blade tucked down and toward your mid-back spine.  Hold for __________ seconds. Release the stretch, by lowering your healthy hand. Repeat __________ times. Complete this exercise __________ times per day. ROM - Internal Rotation   Using an underhand grip, grasp a stick behind your back with both hands.  While standing upright with good posture, slide the stick up your back until you feel a mild stretch in the front of your shoulder.  Hold for __________ seconds. Slowly return to your starting position. Repeat __________ times. Complete this  exercise __________ times per day.  STRETCH  Posterior Shoulder Capsule   Stand or sit with good posture. Grasp your right / left elbow and draw it across your chest, keeping it at the same height as your shoulder.  Pull your elbow, so your upper arm comes in closer to your chest. Pull until you feel a gentle stretch in the back of your shoulder.  Hold for __________ seconds. Repeat __________ times. Complete this exercise __________ times per day. STRENGTHENING EXERCISES - Impingement Syndrome (Rotator Cuff Tendinitis, Bursitis) These exercises may help you when beginning to rehabilitate your injury. They may resolve your symptoms with or without further involvement from your physician, physical therapist or athletic trainer. While completing these exercises, remember:  Muscles can gain both the endurance and the strength needed for everyday activities through controlled exercises.  Complete these exercises as instructed by your physician, physical therapist or athletic trainer. Increase the resistance and repetitions only as guided.  You may experience muscle soreness or fatigue, but the pain or discomfort you are  trying to eliminate should never worsen during these exercises. If this pain does get worse, stop and make sure you are following the directions exactly. If the pain is still present after adjustments, discontinue the exercise until you can discuss the trouble with your clinician.  During your recovery, avoid activity or exercises which involve actions that place your injured hand or elbow above your head or behind your back or head. These positions stress the tissues which you are trying to heal. STRENGTH - Scapular Depression and Adduction   With good posture, sit on a firm chair. Support your arms in front of you, with pillows, arm rests, or on a table top. Have your elbows in line with the sides of your body.  Gently draw your shoulder blades down and toward your mid-back  spine. Gradually increase the tension, without tensing the muscles along the top of your shoulders and the back of your neck.  Hold for __________ seconds. Slowly release the tension and relax your muscles completely before starting the next repetition.  After you have practiced this exercise, remove the arm support and complete the exercise in standing as well as sitting position. Repeat __________ times. Complete this exercise __________ times per day.  STRENGTH - Shoulder Abductors, Isometric  With good posture, stand or sit about 4-6 inches from a wall, with your right / left side facing the wall.  Bend your right / left elbow. Gently press your right / left elbow into the wall. Increase the pressure gradually, until you are pressing as hard as you can, without shrugging your shoulder or increasing any shoulder discomfort.  Hold for __________ seconds.  Release the tension slowly. Relax your shoulder muscles completely before you begin the next repetition. Repeat __________ times. Complete this exercise __________ times per day.  STRENGTH - External Rotators, Isometric  Keep your right / left elbow at your side and bend it 90 degrees.  Step into a door frame so that the outside of your right / left wrist can press against the door frame without your upper arm leaving your side.  Gently press your right / left wrist into the door frame, as if you were trying to swing the back of your hand away from your stomach. Gradually increase the tension, until you are pressing as hard as you can, without shrugging your shoulder or increasing any shoulder discomfort.  Hold for __________ seconds.  Release the tension slowly. Relax your shoulder muscles completely before you begin the next repetition. Repeat __________ times. Complete this exercise __________ times per day.  STRENGTH - Supraspinatus   Stand or sit with good posture. Grasp a __________ weight, or an exercise band or tubing, so  that your hand is "thumbs-up," like you are shaking hands.  Slowly lift your right / left arm in a "V" away from your thigh, diagonally into the space between your side and straight ahead. Lift your hand to shoulder height or as far as you can, without increasing any shoulder pain. At first, many people do not lift their hands above shoulder height.  Avoid shrugging your right / left shoulder as your arm rises, by keeping your shoulder blade tucked down and toward your mid-back spine.  Hold for __________ seconds. Control the descent of your hand, as you slowly return to your starting position. Repeat __________ times. Complete this exercise __________ times per day.  STRENGTH - External Rotators  Secure a rubber exercise band or tubing to a fixed object (table, pole) so  that it is at the same height as your right / left elbow when you are standing or sitting on a firm surface.  Stand or sit so that the secured exercise band is at your uninjured side.  Bend your right / left elbow 90 degrees. Place a folded towel or small pillow under your right / left arm, so that your elbow is a few inches away from your side.  Keeping the tension on the exercise band, pull it away from your body, as if pivoting on your elbow. Be sure to keep your body steady, so that the movement is coming only from your rotating shoulder.  Hold for __________ seconds. Release the tension in a controlled manner, as you return to the starting position. Repeat __________ times. Complete this exercise __________ times per day.  STRENGTH - Internal Rotators   Secure a rubber exercise band or tubing to a fixed object (table, pole) so that it is at the same height as your right / left elbow when you are standing or sitting on a firm surface.  Stand or sit so that the secured exercise band is at your right / left side.  Bend your elbow 90 degrees. Place a folded towel or small pillow under your right / left arm so that your  elbow is a few inches away from your side.  Keeping the tension on the exercise band, pull it across your body, toward your stomach. Be sure to keep your body steady, so that the movement is coming only from your rotating shoulder.  Hold for __________ seconds. Release the tension in a controlled manner, as you return to the starting position. Repeat __________ times. Complete this exercise __________ times per day.  STRENGTH - Scapular Protractors, Standing   Stand arms length away from a wall. Place your hands on the wall, keeping your elbows straight.  Begin by dropping your shoulder blades down and toward your mid-back spine.  To strengthen your protractors, keep your shoulder blades down, but slide them forward on your rib cage. It will feel as if you are lifting the back of your rib cage away from the wall. This is a subtle motion and can be challenging to complete. Ask your caregiver for further instruction, if you are not sure you are doing the exercise correctly.  Hold for __________ seconds. Slowly return to the starting position, resting the muscles completely before starting the next repetition. Repeat __________ times. Complete this exercise __________ times per day. STRENGTH - Scapular Protractors, Supine  Lie on your back on a firm surface. Extend your right / left arm straight into the air while holding a __________ weight in your hand.  Keeping your head and back in place, lift your shoulder off the floor.  Hold for __________ seconds. Slowly return to the starting position, and allow your muscles to relax completely before starting the next repetition. Repeat __________ times. Complete this exercise __________ times per day. STRENGTH - Scapular Protractors, Quadruped  Get onto your hands and knees, with your shoulders directly over your hands (or as close as you can be, comfortably).  Keeping your elbows locked, lift the back of your rib cage up into your shoulder  blades, so your mid-back rounds out. Keep your neck muscles relaxed.  Hold this position for __________ seconds. Slowly return to the starting position and allow your muscles to relax completely before starting the next repetition. Repeat __________ times. Complete this exercise __________ times per day.  STRENGTH - Scapular  Retractors  Secure a rubber exercise band or tubing to a fixed object (table, pole), so that it is at the height of your shoulders when you are either standing, or sitting on a firm armless chair.  With a palm down grip, grasp an end of the band in each hand. Straighten your elbows and lift your hands straight in front of you, at shoulder height. Step back, away from the secured end of the band, until it becomes tense.  Squeezing your shoulder blades together, draw your elbows back toward your sides, as you bend them. Keep your upper arms lifted away from your body throughout the exercise.  Hold for __________ seconds. Slowly ease the tension on the band, as you reverse the directions and return to the starting position. Repeat __________ times. Complete this exercise __________ times per day. STRENGTH - Shoulder Extensors   Secure a rubber exercise band or tubing to a fixed object (table, pole) so that it is at the height of your shoulders when you are either standing, or sitting on a firm armless chair.  With a thumbs-up grip, grasp an end of the band in each hand. Straighten your elbows and lift your hands straight in front of you, at shoulder height. Step back, away from the secured end of the band, until it becomes tense.  Squeezing your shoulder blades together, pull your hands down to the sides of your thighs. Do not allow your hands to go behind you.  Hold for __________ seconds. Slowly ease the tension on the band, as you reverse the directions and return to the starting position. Repeat __________ times. Complete this exercise __________ times per day.  STRENGTH  - Scapular Retractors and External Rotators   Secure a rubber exercise band or tubing to a fixed object (table, pole) so that it is at the height as your shoulders, when you are either standing, or sitting on a firm armless chair.  With a palm down grip, grasp an end of the band in each hand. Bend your elbows 90 degrees and lift your elbows to shoulder height, at your sides. Step back, away from the secured end of the band, until it becomes tense.  Squeezing your shoulder blades together, rotate your shoulders so that your upper arms and elbows remain stationary, but your fists travel upward to head height.  Hold for __________ seconds. Slowly ease the tension on the band, as you reverse the directions and return to the starting position. Repeat __________ times. Complete this exercise __________ times per day.  STRENGTH - Scapular Retractors and External Rotators, Rowing   Secure a rubber exercise band or tubing to a fixed object (table, pole) so that it is at the height of your shoulders, when you are either standing, or sitting on a firm armless chair.  With a palm down grip, grasp an end of the band in each hand. Straighten your elbows and lift your hands straight in front of you, at shoulder height. Step back, away from the secured end of the band, until it becomes tense.  Step 1: Squeeze your shoulder blades together. Bending your elbows, draw your hands to your chest, as if you are rowing a boat. At the end of this motion, your hands and elbow should be at shoulder height and your elbows should be out to your sides.  Step 2: Rotate your shoulders, to raise your hands above your head. Your forearms should be vertical and your upper arms should be horizontal.  Hold for  __________ seconds. Slowly ease the tension on the band, as you reverse the directions and return to the starting position. Repeat __________ times. Complete this exercise __________ times per day.  STRENGTH  Scapular  Depressors  Find a sturdy chair without wheels, such as a dining room chair.  Keeping your feet on the floor, and your hands on the chair arms, lift your bottom up from the seat, and lock your elbows.  Keeping your elbows straight, allow gravity to pull your body weight down. Your shoulders will rise toward your ears.  Raise your body against gravity by drawing your shoulder blades down your back, shortening the distance between your shoulders and ears. Although your feet should always maintain contact with the floor, your feet should progressively support less body weight, as you get stronger.  Hold for __________ seconds. In a controlled and slow manner, lower your body weight to begin the next repetition. Repeat __________ times. Complete this exercise __________ times per day.  Document Released: 12/09/2005 Document Revised: 03/02/2012 Document Reviewed: 03/23/2009 [facility]ExitCare Patient Information 2014 [place]ExitCare, [place].

## 2014-05-02 NOTE — Progress Notes (Signed)
Chief Complaint  Patient presents with  . Left Shoulder Pain    Started 05/01/14.  Pain travels down her arm into her hand.  Has taken 600 mg of Motrin this morning.  Ice helps.  Had an aerobics class on Saturday.    HPI: Patient comes in today for SDA for  new problem evaluation. Onset yesterday  And had severe excrucaiting pain   Left shoulder and radiated to middle fingers like a nerver  No injury per se but does Aerobics  ;Weights last week.   Took 600 ibuprofen and this am.   hda radiating to fingers  And now just shoulder.  Has dec rom from pain hard to lift neck ok area of  Worst pain anterior shoulder  Rubbing/ice  Is right handed  ROS: See pertinent positives and negatives per HPI.non fever  Systemic sx hx of same   Past Medical History  Diagnosis Date  . Allergic rhinitis     seasonal summer cough takes claritin  . Varicella     Family History  Problem Relation Age of Onset  . Breast cancer Mother   . Hypothyroidism Mother   . Alcohol abuse Father   . Hypertension Father     History   Social History  . Marital Status: Single    Spouse Name: N/A    Number of Children: N/A  . Years of Education: N/A   Social History Main Topics  . Smoking status: Never Smoker   . Smokeless tobacco: Never Used  . Alcohol Use: Yes     Comment: per pt a beer once a week   . Drug Use: None  . Sexual Activity: None   Other Topics Concern  . None   Social History Narrative   Single pharmacist   Regular Exercise- yes   hh of 3   Two adopted children     Outpatient Encounter Prescriptions as of 05/02/2014  Medication Sig  . calcium carbonate (OS-CAL) 600 MG TABS Take 600 mg by mouth 2 (two) times daily with a meal.  . citalopram (CELEXA) 20 MG tablet Take 1 tablet (20 mg total) by mouth daily.  Marland Kitchen. glucosamine-chondroitin 500-400 MG tablet Take 1 tablet by mouth 3 (three) times daily.  Marland Kitchen. loratadine (CLARITIN) 10 MG tablet Take 10 mg by mouth daily.  . Multiple Vitamin  (MULTIVITAMIN) tablet Take 1 tablet by mouth daily.  Marland Kitchen. zolpidem (AMBIEN) 10 MG tablet Take 1 tablet (10 mg total) by mouth at bedtime as needed.  . [DISCONTINUED] citalopram (CELEXA) 20 MG tablet Take 1 tablet (20 mg total) by mouth daily.    EXAM:  BP 114/74  Temp(Src) 97.9 F (36.6 C) (Oral)  Ht 5' 7.5" (1.715 m)  Wt 148 lb (67.132 kg)  BMI 22.82 kg/m2  Body mass index is 22.82 kg/(m^2).  GENERAL: vitals reviewed and listed above, alert, oriented, appears well hydrated and in no acute distress holding left arm to body with ice pack  HEENT: atraumatic, conjunctiva  clear, no obvious abnormalities on inspection of external nose and ears NECK: no obvious masses on inspection palpation  CV: HRRR, no clubbing cyanosis or  peripheral edema nl cap refill  MS: moves all extremities  Dec right arm from pain at anterior shoulder  No rash atrophy or neck pain  Localized tenderness below acromion notch no swelling  PSYCH: pleasant and cooperative, no obvious depression or anxiety  ASSESSMENT AND PLAN:  Discussed the following assessment and plan:  Shoulder pain, acute - consider  acute bursitis /  anterior RC area l sling ice nsaid declined  cortisone rx sm option at this time  .consdier further eval as approprioate sling applied and dispensed -Patient advised to return or notify health care team  if symptoms worsen ,persist or new concerns arise.  Patient Instructions  Considerations shoulder tendinitis   Vs other.nerve pain.  antiinflammatory .  2.5 aleve twice a day  Consider steroid injection and or oral  Course if tendinitis. Consider seeing sports medicine.  conact [place]     Impingement Syndrome, Rotator Cuff, Bursitis with Rehab Impingement syndrome is a condition that involves inflammation of the tendons of the rotator cuff and the subacromial bursa, that causes pain in the shoulder. The rotator cuff consists of four tendons and muscles that control much of the shoulder and upper  arm function. The subacromial bursa is a fluid filled sac that helps reduce friction between the rotator cuff and one of the bones of the shoulder (acromion). Impingement syndrome is usually an overuse injury that causes swelling of the bursa (bursitis), swelling of the tendon (tendonitis), and/or a tear of the tendon (strain). Strains are classified into three categories. Grade 1 strains cause pain, but the tendon is not lengthened. Grade 2 strains include a lengthened ligament, due to the ligament being stretched or partially ruptured. With grade 2 strains there is still function, although the function may be decreased. Grade 3 strains include a complete tear of the tendon or muscle, and function is usually impaired. SYMPTOMS   Pain around the shoulder, often at the outer portion of the upper arm.  Pain that gets worse with shoulder function, especially when reaching overhead or lifting.  Sometimes, aching when not using the arm.  Pain that wakes you up at night.  Sometimes, tenderness, swelling, warmth, or redness over the affected area.  Loss of strength.  Limited motion of the shoulder, especially reaching behind the back (to the back pocket or to unhook bra) or across your body.  Crackling sound (crepitation) when moving the arm.  Biceps tendon pain and inflammation (in the front of the shoulder). Worse when bending the elbow or lifting. CAUSES  Impingement syndrome is often an overuse injury, in which chronic (repetitive) motions cause the tendons or bursa to become inflamed. A strain occurs when a force is paced on the tendon or muscle that is greater than it can withstand. Common mechanisms of injury include: Stress from sudden increase in duration, frequency, or intensity of training.  Direct hit (trauma) to the shoulder.  Aging, erosion of the tendon with normal use.  Bony bump on shoulder (acromial spur). RISK INCREASES WITH:  Contact sports (football, wrestling,  boxing).  Throwing sports (baseball, tennis, volleyball).  Weightlifting and bodybuilding.  Heavy labor.  Previous injury to the rotator cuff, including impingement.  Poor shoulder strength and flexibility.  Failure to warm up properly before activity.  Inadequate protective equipment.  Old age.  Bony bump on shoulder (acromial spur). PREVENTION   Warm up and stretch properly before activity.  Allow for adequate recovery between workouts.  Maintain physical fitness:  Strength, flexibility, and endurance.  Cardiovascular fitness.  Learn and use proper exercise technique. PROGNOSIS  If treated properly, impingement syndrome usually goes away within 6 weeks. Sometimes surgery is required.  RELATED COMPLICATIONS   Longer healing time if not properly treated, or if not given enough time to heal.  Recurring symptoms, that result in a chronic condition.  Shoulder stiffness, frozen shoulder, or loss of  motion.  Rotator cuff tendon tear.  Recurring symptoms, especially if activity is resumed too soon, with overuse, with a direct blow, or when using poor technique. TREATMENT  Treatment first involves the use of ice and medicine, to reduce pain and inflammation. The use of strengthening and stretching exercises may help reduce pain with activity. These exercises may be performed at home or with a therapist. If non-surgical treatment is unsuccessful after more than 6 months, surgery may be advised. After surgery and rehabilitation, activity is usually possible in 3 months.  MEDICATION  If pain medicine is needed, nonsteroidal anti-inflammatory medicines (aspirin and ibuprofen), or other minor pain relievers (acetaminophen), are often advised.  Do not take pain medicine for 7 days before surgery.  Prescription pain relievers may be given, if your caregiver thinks they are needed. Use only as directed and only as much as you need.  Corticosteroid injections may be given by  your caregiver. These injections should be reserved for the most serious cases, because they may only be given a certain number of times. HEAT AND COLD  Cold treatment (icing) should be applied for 10 to 15 minutes every 2 to 3 hours for inflammation and pain, and immediately after activity that aggravates your symptoms. Use ice packs or an ice massage.  Heat treatment may be used before performing stretching and strengthening activities prescribed by your caregiver, physical therapist, or athletic trainer. Use a heat pack or a warm water soak. SEEK MEDICAL CARE IF:   Symptoms get worse or do not improve in 4 to 6 weeks, despite treatment.  New, unexplained symptoms develop. (Drugs used in treatment may produce side effects.) EXERCISES  RANGE OF MOTION (ROM) AND STRETCHING EXERCISES - Impingement Syndrome (Rotator Cuff  Tendinitis, Bursitis) These exercises may help you when beginning to rehabilitate your injury. Your symptoms may go away with or without further involvement from your physician, physical therapist or athletic trainer. While completing these exercises, remember:   Restoring tissue flexibility helps normal motion to return to the joints. This allows healthier, less painful movement and activity.  An effective stretch should be held for at least 30 seconds.  A stretch should never be painful. You should only feel a gentle lengthening or release in the stretched tissue. STRETCH  Flexion, Standing  Stand with good posture. With an underhand grip on your right / left hand, and an overhand grip on the opposite hand, grasp a broomstick or cane so that your hands are a little more than shoulder width apart.  Keeping your right / left elbow straight and shoulder muscles relaxed, push the stick with your opposite hand, to raise your right / left arm in front of your body and then overhead. Raise your arm until you feel a stretch in your right / left shoulder, but before you have  increased shoulder pain.  Try to avoid shrugging your right / left shoulder as your arm rises, by keeping your shoulder blade tucked down and toward your mid-back spine. Hold for __________ seconds.  Slowly return to the starting position. Repeat __________ times. Complete this exercise __________ times per day. STRETCH  Abduction, Supine  Lie on your back. With an underhand grip on your right / left hand and an overhand grip on the opposite hand, grasp a broomstick or cane so that your hands are a little more than shoulder width apart.  Keeping your right / left elbow straight and your shoulder muscles relaxed, push the stick with your opposite hand, to  raise your right / left arm out to the side of your body and then overhead. Raise your arm until you feel a stretch in your right / left shoulder, but before you have increased shoulder pain.  Try to avoid shrugging your right / left shoulder as your arm rises, by keeping your shoulder blade tucked down and toward your mid-back spine. Hold for __________ seconds.  Slowly return to the starting position. Repeat __________ times. Complete this exercise __________ times per day. ROM  Flexion, Active-Assisted  Lie on your back. You may bend your knees for comfort.  Grasp a broomstick or cane so your hands are about shoulder width apart. Your right / left hand should grip the end of the stick, so that your hand is positioned "thumbs-up," as if you were about to shake hands.  Using your healthy arm to lead, raise your right / left arm overhead, until you feel a gentle stretch in your shoulder. Hold for __________ seconds.  Use the stick to assist in returning your right / left arm to its starting position. Repeat __________ times. Complete this exercise __________ times per day.  ROM - Internal Rotation, Supine   Lie on your back on a firm surface. Place your right / left elbow about 60 degrees away from your side. Elevate your elbow with a  folded towel, so that the elbow and shoulder are the same height.  Using a broomstick or cane and your strong arm, pull your right / left hand toward your body until you feel a gentle stretch, but no increase in your shoulder pain. Keep your shoulder and elbow in place throughout the exercise.  Hold for __________ seconds. Slowly return to the starting position. Repeat __________ times. Complete this exercise __________ times per day. STRETCH - Internal Rotation  Place your right / left hand behind your back, palm up.  Throw a towel or belt over your opposite shoulder. Grasp the towel with your right / left hand.  While keeping an upright posture, gently pull up on the towel, until you feel a stretch in the front of your right / left shoulder.  Avoid shrugging your right / left shoulder as your arm rises, by keeping your shoulder blade tucked down and toward your mid-back spine.  Hold for __________ seconds. Release the stretch, by lowering your healthy hand. Repeat __________ times. Complete this exercise __________ times per day. ROM - Internal Rotation   Using an underhand grip, grasp a stick behind your back with both hands.  While standing upright with good posture, slide the stick up your back until you feel a mild stretch in the front of your shoulder.  Hold for __________ seconds. Slowly return to your starting position. Repeat __________ times. Complete this exercise __________ times per day.  STRETCH  Posterior Shoulder Capsule   Stand or sit with good posture. Grasp your right / left elbow and draw it across your chest, keeping it at the same height as your shoulder.  Pull your elbow, so your upper arm comes in closer to your chest. Pull until you feel a gentle stretch in the back of your shoulder.  Hold for __________ seconds. Repeat __________ times. Complete this exercise __________ times per day. STRENGTHENING EXERCISES - Impingement Syndrome (Rotator Cuff Tendinitis,  Bursitis) These exercises may help you when beginning to rehabilitate your injury. They may resolve your symptoms with or without further involvement from your physician, physical therapist or athletic trainer. While completing these exercises, remember:  Muscles can gain both the endurance and the strength needed for everyday activities through controlled exercises.  Complete these exercises as instructed by your physician, physical therapist or athletic trainer. Increase the resistance and repetitions only as guided.  You may experience muscle soreness or fatigue, but the pain or discomfort you are trying to eliminate should never worsen during these exercises. If this pain does get worse, stop and make sure you are following the directions exactly. If the pain is still present after adjustments, discontinue the exercise until you can discuss the trouble with your clinician.  During your recovery, avoid activity or exercises which involve actions that place your injured hand or elbow above your head or behind your back or head. These positions stress the tissues which you are trying to heal. STRENGTH - Scapular Depression and Adduction   With good posture, sit on a firm chair. Support your arms in front of you, with pillows, arm rests, or on a table top. Have your elbows in line with the sides of your body.  Gently draw your shoulder blades down and toward your mid-back spine. Gradually increase the tension, without tensing the muscles along the top of your shoulders and the back of your neck.  Hold for __________ seconds. Slowly release the tension and relax your muscles completely before starting the next repetition.  After you have practiced this exercise, remove the arm support and complete the exercise in standing as well as sitting position. Repeat __________ times. Complete this exercise __________ times per day.  STRENGTH - Shoulder Abductors, Isometric  With good posture, stand or  sit about 4-6 inches from a wall, with your right / left side facing the wall.  Bend your right / left elbow. Gently press your right / left elbow into the wall. Increase the pressure gradually, until you are pressing as hard as you can, without shrugging your shoulder or increasing any shoulder discomfort.  Hold for __________ seconds.  Release the tension slowly. Relax your shoulder muscles completely before you begin the next repetition. Repeat __________ times. Complete this exercise __________ times per day.  STRENGTH - External Rotators, Isometric  Keep your right / left elbow at your side and bend it 90 degrees.  Step into a door frame so that the outside of your right / left wrist can press against the door frame without your upper arm leaving your side.  Gently press your right / left wrist into the door frame, as if you were trying to swing the back of your hand away from your stomach. Gradually increase the tension, until you are pressing as hard as you can, without shrugging your shoulder or increasing any shoulder discomfort.  Hold for __________ seconds.  Release the tension slowly. Relax your shoulder muscles completely before you begin the next repetition. Repeat __________ times. Complete this exercise __________ times per day.  STRENGTH - Supraspinatus   Stand or sit with good posture. Grasp a __________ weight, or an exercise band or tubing, so that your hand is "thumbs-up," like you are shaking hands.  Slowly lift your right / left arm in a "V" away from your thigh, diagonally into the space between your side and straight ahead. Lift your hand to shoulder height or as far as you can, without increasing any shoulder pain. At first, many people do not lift their hands above shoulder height.  Avoid shrugging your right / left shoulder as your arm rises, by keeping your shoulder blade tucked down and toward  your mid-back spine.  Hold for __________ seconds. Control the  descent of your hand, as you slowly return to your starting position. Repeat __________ times. Complete this exercise __________ times per day.  STRENGTH - External Rotators  Secure a rubber exercise band or tubing to a fixed object (table, pole) so that it is at the same height as your right / left elbow when you are standing or sitting on a firm surface.  Stand or sit so that the secured exercise band is at your uninjured side.  Bend your right / left elbow 90 degrees. Place a folded towel or small pillow under your right / left arm, so that your elbow is a few inches away from your side.  Keeping the tension on the exercise band, pull it away from your body, as if pivoting on your elbow. Be sure to keep your body steady, so that the movement is coming only from your rotating shoulder.  Hold for __________ seconds. Release the tension in a controlled manner, as you return to the starting position. Repeat __________ times. Complete this exercise __________ times per day.  STRENGTH - Internal Rotators   Secure a rubber exercise band or tubing to a fixed object (table, pole) so that it is at the same height as your right / left elbow when you are standing or sitting on a firm surface.  Stand or sit so that the secured exercise band is at your right / left side.  Bend your elbow 90 degrees. Place a folded towel or small pillow under your right / left arm so that your elbow is a few inches away from your side.  Keeping the tension on the exercise band, pull it across your body, toward your stomach. Be sure to keep your body steady, so that the movement is coming only from your rotating shoulder.  Hold for __________ seconds. Release the tension in a controlled manner, as you return to the starting position. Repeat __________ times. Complete this exercise __________ times per day.  STRENGTH - Scapular Protractors, Standing   Stand arms length away from a wall. Place your hands on the wall,  keeping your elbows straight.  Begin by dropping your shoulder blades down and toward your mid-back spine.  To strengthen your protractors, keep your shoulder blades down, but slide them forward on your rib cage. It will feel as if you are lifting the back of your rib cage away from the wall. This is a subtle motion and can be challenging to complete. Ask your caregiver for further instruction, if you are not sure you are doing the exercise correctly.  Hold for __________ seconds. Slowly return to the starting position, resting the muscles completely before starting the next repetition. Repeat __________ times. Complete this exercise __________ times per day. STRENGTH - Scapular Protractors, Supine  Lie on your back on a firm surface. Extend your right / left arm straight into the air while holding a __________ weight in your hand.  Keeping your head and back in place, lift your shoulder off the floor.  Hold for __________ seconds. Slowly return to the starting position, and allow your muscles to relax completely before starting the next repetition. Repeat __________ times. Complete this exercise __________ times per day. STRENGTH - Scapular Protractors, Quadruped  Get onto your hands and knees, with your shoulders directly over your hands (or as close as you can be, comfortably).  Keeping your elbows locked, lift the back of your rib cage up into  your shoulder blades, so your mid-back rounds out. Keep your neck muscles relaxed.  Hold this position for __________ seconds. Slowly return to the starting position and allow your muscles to relax completely before starting the next repetition. Repeat __________ times. Complete this exercise __________ times per day.  STRENGTH - Scapular Retractors  Secure a rubber exercise band or tubing to a fixed object (table, pole), so that it is at the height of your shoulders when you are either standing, or sitting on a firm armless chair.  With a palm  down grip, grasp an end of the band in each hand. Straighten your elbows and lift your hands straight in front of you, at shoulder height. Step back, away from the secured end of the band, until it becomes tense.  Squeezing your shoulder blades together, draw your elbows back toward your sides, as you bend them. Keep your upper arms lifted away from your body throughout the exercise.  Hold for __________ seconds. Slowly ease the tension on the band, as you reverse the directions and return to the starting position. Repeat __________ times. Complete this exercise __________ times per day. STRENGTH - Shoulder Extensors   Secure a rubber exercise band or tubing to a fixed object (table, pole) so that it is at the height of your shoulders when you are either standing, or sitting on a firm armless chair.  With a thumbs-up grip, grasp an end of the band in each hand. Straighten your elbows and lift your hands straight in front of you, at shoulder height. Step back, away from the secured end of the band, until it becomes tense.  Squeezing your shoulder blades together, pull your hands down to the sides of your thighs. Do not allow your hands to go behind you.  Hold for __________ seconds. Slowly ease the tension on the band, as you reverse the directions and return to the starting position. Repeat __________ times. Complete this exercise __________ times per day.  STRENGTH - Scapular Retractors and External Rotators   Secure a rubber exercise band or tubing to a fixed object (table, pole) so that it is at the height as your shoulders, when you are either standing, or sitting on a firm armless chair.  With a palm down grip, grasp an end of the band in each hand. Bend your elbows 90 degrees and lift your elbows to shoulder height, at your sides. Step back, away from the secured end of the band, until it becomes tense.  Squeezing your shoulder blades together, rotate your shoulders so that your upper  arms and elbows remain stationary, but your fists travel upward to head height.  Hold for __________ seconds. Slowly ease the tension on the band, as you reverse the directions and return to the starting position. Repeat __________ times. Complete this exercise __________ times per day.  STRENGTH - Scapular Retractors and External Rotators, Rowing   Secure a rubber exercise band or tubing to a fixed object (table, pole) so that it is at the height of your shoulders, when you are either standing, or sitting on a firm armless chair.  With a palm down grip, grasp an end of the band in each hand. Straighten your elbows and lift your hands straight in front of you, at shoulder height. Step back, away from the secured end of the band, until it becomes tense.  Step 1: Squeeze your shoulder blades together. Bending your elbows, draw your hands to your chest, as if you are rowing a  boat. At the end of this motion, your hands and elbow should be at shoulder height and your elbows should be out to your sides.  Step 2: Rotate your shoulders, to raise your hands above your head. Your forearms should be vertical and your upper arms should be horizontal.  Hold for __________ seconds. Slowly ease the tension on the band, as you reverse the directions and return to the starting position. Repeat __________ times. Complete this exercise __________ times per day.  STRENGTH  Scapular Depressors  Find a sturdy chair without wheels, such as a dining room chair.  Keeping your feet on the floor, and your hands on the chair arms, lift your bottom up from the seat, and lock your elbows.  Keeping your elbows straight, allow gravity to pull your body weight down. Your shoulders will rise toward your ears.  Raise your body against gravity by drawing your shoulder blades down your back, shortening the distance between your shoulders and ears. Although your feet should always maintain contact with the floor, your feet  should progressively support less body weight, as you get stronger.  Hold for __________ seconds. In a controlled and slow manner, lower your body weight to begin the next repetition. Repeat __________ times. Complete this exercise __________ times per day.  Document Released: 12/09/2005 Document Revised: 03/02/2012 Document Reviewed: 03/23/2009 [facility] Patient Information 2014 [employer], [place].       Neta MendsWanda K. Panosh M.D.  Pre visit review using our clinic review tool, if applicable. No additional management support is needed unless otherwise documented below in the visit note.

## 2014-05-27 ENCOUNTER — Telehealth: Payer: Self-pay | Admitting: Family Medicine

## 2014-05-27 ENCOUNTER — Other Ambulatory Visit: Payer: Self-pay | Admitting: Internal Medicine

## 2014-05-27 NOTE — Telephone Encounter (Signed)
Sent to the pharmacy by e-scribe.  Will send a note to the front to get pt scheduled.

## 2014-05-27 NOTE — Telephone Encounter (Signed)
Per WP, Please schedule the pt for a follow up or yearly visit in the next 3 months.  Let me know if she wants a yearly visit and I will put in lab work. Thanks!

## 2014-05-27 NOTE — Telephone Encounter (Signed)
Ok x 90 days  OV or yearly check before other refills

## 2014-06-17 NOTE — Telephone Encounter (Signed)
Pt would like a cpe. Pt is out of work July and aug and prefers to come in during these months.  appt scheduled 8/5, so you can put those labs in.!!

## 2014-06-20 ENCOUNTER — Other Ambulatory Visit: Payer: Self-pay | Admitting: Family Medicine

## 2014-06-20 DIAGNOSIS — Z0000 Encounter for general adult medical examination without abnormal findings: Secondary | ICD-10-CM

## 2014-06-20 NOTE — Telephone Encounter (Signed)
Order placed in the system. 

## 2014-07-06 ENCOUNTER — Encounter: Payer: Self-pay | Admitting: Internal Medicine

## 2014-07-11 ENCOUNTER — Encounter: Payer: Self-pay | Admitting: Internal Medicine

## 2014-07-12 ENCOUNTER — Encounter: Payer: Self-pay | Admitting: Internal Medicine

## 2014-07-18 ENCOUNTER — Other Ambulatory Visit (INDEPENDENT_AMBULATORY_CARE_PROVIDER_SITE_OTHER): Payer: 59

## 2014-07-18 DIAGNOSIS — Z0000 Encounter for general adult medical examination without abnormal findings: Secondary | ICD-10-CM

## 2014-07-18 LAB — CBC WITH DIFFERENTIAL/PLATELET
BASOS ABS: 0 10*3/uL (ref 0.0–0.1)
Basophils Relative: 0.2 % (ref 0.0–3.0)
Eosinophils Absolute: 0.1 10*3/uL (ref 0.0–0.7)
Eosinophils Relative: 1.3 % (ref 0.0–5.0)
HEMATOCRIT: 42.4 % (ref 36.0–46.0)
Hemoglobin: 14.2 g/dL (ref 12.0–15.0)
LYMPHS ABS: 1.5 10*3/uL (ref 0.7–4.0)
Lymphocytes Relative: 37.2 % (ref 12.0–46.0)
MCHC: 33.4 g/dL (ref 30.0–36.0)
MCV: 89.6 fl (ref 78.0–100.0)
MONO ABS: 0.3 10*3/uL (ref 0.1–1.0)
Monocytes Relative: 7.2 % (ref 3.0–12.0)
NEUTROS ABS: 2.3 10*3/uL (ref 1.4–7.7)
Neutrophils Relative %: 54.1 % (ref 43.0–77.0)
PLATELETS: 181 10*3/uL (ref 150.0–400.0)
RBC: 4.74 Mil/uL (ref 3.87–5.11)
RDW: 13.3 % (ref 11.5–15.5)
WBC: 4.2 10*3/uL (ref 4.0–10.5)

## 2014-07-18 LAB — BASIC METABOLIC PANEL
BUN: 18 mg/dL (ref 6–23)
CO2: 25 meq/L (ref 19–32)
Calcium: 9.1 mg/dL (ref 8.4–10.5)
Chloride: 106 mEq/L (ref 96–112)
Creatinine, Ser: 0.9 mg/dL (ref 0.4–1.2)
GFR: 67.09 mL/min (ref >60.00)
GLUCOSE: 83 mg/dL (ref 70–99)
POTASSIUM: 4.2 meq/L (ref 3.5–5.1)
SODIUM: 140 meq/L (ref 135–145)

## 2014-07-18 LAB — TSH: TSH: 1.07 u[IU]/mL (ref 0.35–4.50)

## 2014-07-18 LAB — LIPID PANEL
CHOLESTEROL: 195 mg/dL (ref 0–200)
HDL: 91.1 mg/dL (ref >39.00)
LDL CALC: 93 mg/dL (ref 0–99)
NONHDL: 103.9
Total CHOL/HDL Ratio: 2
Triglycerides: 57 mg/dL (ref 0.0–149.0)
VLDL: 11.4 mg/dL (ref 0.0–40.0)

## 2014-07-18 LAB — HEPATIC FUNCTION PANEL
ALBUMIN: 4 g/dL (ref 3.5–5.2)
ALK PHOS: 67 U/L (ref 39–117)
ALT: 14 U/L (ref 0–35)
AST: 20 U/L (ref 0–37)
BILIRUBIN DIRECT: 0.1 mg/dL (ref 0.0–0.3)
Total Bilirubin: 1.1 mg/dL (ref 0.2–1.2)
Total Protein: 6.6 g/dL (ref 6.0–8.3)

## 2014-07-20 ENCOUNTER — Other Ambulatory Visit: Payer: 59

## 2014-07-25 ENCOUNTER — Telehealth: Payer: Self-pay | Admitting: Internal Medicine

## 2014-07-25 ENCOUNTER — Encounter: Payer: Self-pay | Admitting: Internal Medicine

## 2014-07-25 NOTE — Telephone Encounter (Signed)
Pt cancelled her cpx on 07-27-14 and gave appt to her daughter for wcc, Can I create 30 min slot before sept 2015

## 2014-07-25 NOTE — Telephone Encounter (Signed)
sure

## 2014-07-26 NOTE — Telephone Encounter (Signed)
Pt cancelled daughter appt and resume 07-27-14 appt

## 2014-07-27 ENCOUNTER — Ambulatory Visit: Payer: 59 | Admitting: Internal Medicine

## 2014-07-27 ENCOUNTER — Ambulatory Visit (INDEPENDENT_AMBULATORY_CARE_PROVIDER_SITE_OTHER): Payer: 59 | Admitting: Internal Medicine

## 2014-07-27 ENCOUNTER — Encounter: Payer: Self-pay | Admitting: Internal Medicine

## 2014-07-27 VITALS — BP 106/80 | Temp 98.3°F | Ht 67.0 in | Wt 144.0 lb

## 2014-07-27 DIAGNOSIS — N959 Unspecified menopausal and perimenopausal disorder: Secondary | ICD-10-CM

## 2014-07-27 DIAGNOSIS — Z0000 Encounter for general adult medical examination without abnormal findings: Secondary | ICD-10-CM

## 2014-07-27 NOTE — Progress Notes (Signed)
Pre visit review using our clinic review tool, if applicable. No additional management support is needed unless otherwise documented below in the visit note.  Chief Complaint  Patient presents with  . Annual Exam    HPI: Patient comes in today for Preventive Health Care visit  No major change in health status since last visit . Citalopram helping the flushes .  Summer allergy .  No major change in health status since last visit . Feels well  In between jobs and doing well  Not using ambien as not traveling now.   Health Maintenance  Topic Date Due  . Pap Smear  08/29/2013  . Influenza Vaccine  07/23/2014  . Mammogram  09/18/2015  . Tetanus/tdap  12/23/2016  . Colonoscopy  12/30/2018   Health Maintenance Review LIFESTYLE:  Exercise:  Walks ans swims   And gym  Every other day.  Tobacco/ETS: no  Alcohol: 1 per week.  Sugar beverages: no  Sleep:  7-8  Drug use: no Bone density:  Colonoscopy:  Not due  mammo due soon .    ROS:  GEN/ HEENT: No fever, significant weight changes sweats headaches vision problems hearing changes, CV/ PULM; No chest pain shortness of breath cough, syncope,edema  change in exercise tolerance. GI /GU: No adominal pain, vomiting, change in bowel habits. No blood in the stool. No significant GU symptoms. SKIN/HEME: ,no acute skin rashes suspicious lesions or bleeding. No lymphadenopathy, nodules, masses.  NEURO/ PSYCH:  No neurologic signs such as weakness numbness. No depression anxiety. IMM/ Allergy: No unusual infections.  Allergy .   REST of 12 system review negative except as per HPI   Past Medical History  Diagnosis Date  . Allergic rhinitis     seasonal summer cough takes claritin  . Varicella     Family History  Problem Relation Age of Onset  . Breast cancer Mother   . Hypothyroidism Mother   . Alcohol abuse Father   . Hypertension Father     History   Social History  . Marital Status: Single    Spouse Name: N/A    Number  of Children: N/A  . Years of Education: N/A   Social History Main Topics  . Smoking status: Never Smoker   . Smokeless tobacco: Never Used  . Alcohol Use: Yes     Comment: per pt a beer once a week   . Drug Use: None  . Sexual Activity: None   Other Topics Concern  . None   Social History Narrative   Single pharmacist   Regular Exercise- yes   hh of 3   Two adopted children     Outpatient Encounter Prescriptions as of 07/27/2014  Medication Sig  . calcium carbonate (OS-CAL) 600 MG TABS Take 600 mg by mouth 2 (two) times daily with a meal.  . citalopram (CELEXA) 20 MG tablet TAKE 1 TABLET (20 MG TOTAL) BY MOUTH DAILY.  Marland Kitchen. glucosamine-chondroitin 500-400 MG tablet Take 1 tablet by mouth 3 (three) times daily.  Marland Kitchen. loratadine (CLARITIN) 10 MG tablet Take 10 mg by mouth daily.  . Multiple Vitamin (MULTIVITAMIN) tablet Take 1 tablet by mouth daily.  Marland Kitchen. zolpidem (AMBIEN) 10 MG tablet Take 1 tablet (10 mg total) by mouth at bedtime as needed.    EXAM:  BP 106/80  Temp(Src) 98.3 F (36.8 C) (Oral)  Ht 5\' 7"  (1.702 m)  Wt 144 lb (65.318 kg)  BMI 22.55 kg/m2  Body mass index is 22.55 kg/(m^2).  Physical Exam:  Vital signs reviewed ZOX:WRUE is a well-developed well-nourished alert cooperative    who appearsr stated age in no acute distress.  HEENT: normocephalic atraumatic , Eyes: PERRL EOM's full, conjunctiva clear, Nares: paten,t no deformity discharge or tenderness., Ears: no deformity EAC's clear TMs with normal landmarks. Mouth: clear OP, no lesions, edema.  Moist mucous membranes. Dentition in adequate repair. NECK: supple without masses, thyromegaly or bruits. CHEST/PULM:  Clear to auscultation and percussion breath sounds equal no wheeze , rales or rhonchi. No chest wall deformities or tenderness. Breast: normal by inspection . No dimpling, discharge, masses, tenderness or discharge . CV: PMI is nondisplaced, S1 S2 no gallops, murmurs, rubs. Peripheral pulses are full without  delay.No JVD .  ABDOMEN: Bowel sounds normal nontender  No guard or rebound, no hepato splenomegal no CVA tenderness.  No hernia. Extremtities:  No clubbing cyanosis or edema, no acute joint swelling or redness no focal atrophy NEURO:  Oriented x3, cranial nerves 3-12 appear to be intact, no obvious focal weakness,gait within normal limits no abnormal reflexes or asymmetrical SKIN: No acute rashes normal turgor, color, no bruising or petechiae. Sun changes  No acute findings some sks  PSYCH: Oriented, good eye contact, no obvious depression anxiety, cognition and judgment appear normal. LN: no cervical axillary inguinal adenopathy  Lab Results  Component Value Date   WBC 4.2 07/18/2014   HGB 14.2 07/18/2014   HCT 42.4 07/18/2014   PLT 181.0 07/18/2014   GLUCOSE 83 07/18/2014   CHOL 195 07/18/2014   TRIG 57.0 07/18/2014   HDL 91.10 07/18/2014   LDLCALC 93 07/18/2014   ALT 14 07/18/2014   AST 20 07/18/2014   NA 140 07/18/2014   K 4.2 07/18/2014   CL 106 07/18/2014   CREATININE 0.9 07/18/2014   BUN 18 07/18/2014   CO2 25 07/18/2014   TSH 1.07 07/18/2014    ASSESSMENT AND PLAN:  Discussed the following assessment and plan:  Visit for preventive health examination - continue healthy  habits get mammo get pap in next year   Menopausal and perimenopausal disorder - ctalopram helps  can continue. Pt is due for a pap  In next year   Not otherwise in record Never had abnormal  Patient Care Team: Madelin Headings, MD as PCP - General Patient Instructions  Continue lifestyle intervention healthy eating and exercise .  Healthy lifestyle includes : At least 150 minutes of exercise weeks  , weight at healthy levels, which is usually   BMI 19-25. Avoid trans fats and processed foods;  Increase fresh fruits and veges to 5 servings per day. And avoid sweet beverages including tea and juice. Mediterranean diet with olive oil and nuts have been noted to be heart and brain healthy . Avoid tobacco products  . Limit  alcohol to  7 per week for women and 14 servings for men.  Get adequate sleep .  Ok to continue on citalopram for the flushes . Get your mammogram.     Neta Mends. Mathias Bogacki M.D.

## 2014-07-27 NOTE — Patient Instructions (Signed)
Continue lifestyle intervention healthy eating and exercise .  Healthy lifestyle includes : At least 150 minutes of exercise weeks  , weight at healthy levels, which is usually   BMI 19-25. Avoid trans fats and processed foods;  Increase fresh fruits and veges to 5 servings per day. And avoid sweet beverages including tea and juice. Mediterranean diet with olive oil and nuts have been noted to be heart and brain healthy . Avoid tobacco products . Limit  alcohol to  7 per week for women and 14 servings for men.  Get adequate sleep .  Ok to continue on citalopram for the flushes . Get your mammogram.

## 2014-08-06 ENCOUNTER — Encounter: Payer: Self-pay | Admitting: Internal Medicine

## 2014-08-08 NOTE — Telephone Encounter (Signed)
Ok to refill 90 days  Until due for med check.  Before she runs out of this med

## 2014-08-10 ENCOUNTER — Encounter: Payer: Self-pay | Admitting: Internal Medicine

## 2014-08-22 ENCOUNTER — Other Ambulatory Visit: Payer: Self-pay | Admitting: Internal Medicine

## 2014-08-22 NOTE — Telephone Encounter (Signed)
Sent to the pharmacy by e-scribe. 

## 2014-08-23 NOTE — Telephone Encounter (Signed)
Denied.  Filled for 1 year on 08/22/14

## 2014-08-25 ENCOUNTER — Other Ambulatory Visit: Payer: Self-pay

## 2014-08-25 DIAGNOSIS — Z1231 Encounter for screening mammogram for malignant neoplasm of breast: Secondary | ICD-10-CM

## 2014-08-30 ENCOUNTER — Encounter: Payer: Self-pay | Admitting: Internal Medicine

## 2014-09-02 ENCOUNTER — Ambulatory Visit (INDEPENDENT_AMBULATORY_CARE_PROVIDER_SITE_OTHER): Payer: 59

## 2014-09-02 DIAGNOSIS — Z23 Encounter for immunization: Secondary | ICD-10-CM

## 2014-09-06 ENCOUNTER — Encounter: Payer: Self-pay | Admitting: Internal Medicine

## 2014-09-19 ENCOUNTER — Ambulatory Visit
Admission: RE | Admit: 2014-09-19 | Discharge: 2014-09-19 | Disposition: A | Discharge status: Home / Self Care | Payer: 59 | Source: Ambulatory Visit

## 2014-09-19 DIAGNOSIS — Z1231 Encounter for screening mammogram for malignant neoplasm of breast: Secondary | ICD-10-CM

## 2014-10-24 ENCOUNTER — Telehealth: Payer: Self-pay | Admitting: Internal Medicine

## 2014-10-24 NOTE — Telephone Encounter (Signed)
Pt states she received mychart email she should get a pap only. Pt got cpe in august, but no pap.  Do you need 30 min?

## 2014-10-24 NOTE — Telephone Encounter (Signed)
Per WP's last office note.  She can wait until next wellness exam for pap since she has had no abnormal in the past.

## 2014-10-25 NOTE — Telephone Encounter (Signed)
Lm on vm to cb °

## 2014-11-02 ENCOUNTER — Ambulatory Visit: Payer: 59 | Admitting: Internal Medicine

## 2014-11-09 NOTE — Telephone Encounter (Signed)
Pt aware.

## 2015-03-13 ENCOUNTER — Encounter: Payer: Self-pay | Admitting: Internal Medicine

## 2015-03-14 MED ORDER — ZOLPIDEM TARTRATE 10 MG PO TABS: 10.0000 mg | ORAL_TABLET | Freq: Every evening | ORAL | Status: DC | PRN

## 2015-06-16 ENCOUNTER — Ambulatory Visit (INDEPENDENT_AMBULATORY_CARE_PROVIDER_SITE_OTHER): Payer: 59 | Admitting: Internal Medicine

## 2015-06-16 ENCOUNTER — Encounter: Payer: Self-pay | Admitting: Internal Medicine

## 2015-06-16 VITALS — BP 126/70 | Temp 98.6°F | Ht 67.0 in | Wt 147.2 lb

## 2015-06-16 DIAGNOSIS — R202 Paresthesia of skin: Secondary | ICD-10-CM | POA: Diagnosis not present

## 2015-06-16 DIAGNOSIS — M79643 Pain in unspecified hand: Secondary | ICD-10-CM | POA: Diagnosis not present

## 2015-06-16 LAB — HEPATIC FUNCTION PANEL
ALT: 15 U/L (ref 0–35)
AST: 19 U/L (ref 0–37)
Albumin: 4 g/dL (ref 3.5–5.2)
Alkaline Phosphatase: 69 U/L (ref 39–117)
BILIRUBIN DIRECT: 0.1 mg/dL (ref 0.0–0.3)
BILIRUBIN TOTAL: 1.1 mg/dL (ref 0.2–1.2)
Total Protein: 6.7 g/dL (ref 6.0–8.3)

## 2015-06-16 LAB — HEMOGLOBIN A1C: HEMOGLOBIN A1C: 5.3 % (ref 4.6–6.5)

## 2015-06-16 LAB — BASIC METABOLIC PANEL
BUN: 20 mg/dL (ref 6–23)
CALCIUM: 9.7 mg/dL (ref 8.4–10.5)
CHLORIDE: 104 meq/L (ref 96–112)
CO2: 33 mEq/L — ABNORMAL HIGH (ref 19–32)
CREATININE: 0.88 mg/dL (ref 0.40–1.20)
GFR: 70.39 mL/min (ref >60.00)
Glucose, Bld: 72 mg/dL (ref 70–99)
Potassium: 4.7 mEq/L (ref 3.5–5.1)
Sodium: 138 mEq/L (ref 135–145)

## 2015-06-16 LAB — CBC WITH DIFFERENTIAL/PLATELET
BASOS ABS: 0 10*3/uL (ref 0.0–0.1)
Basophils Relative: 0.6 % (ref 0.0–3.0)
EOS ABS: 0.1 10*3/uL (ref 0.0–0.7)
Eosinophils Relative: 1.5 % (ref 0.0–5.0)
HCT: 43.3 % (ref 36.0–46.0)
Hemoglobin: 14.6 g/dL (ref 12.0–15.0)
LYMPHS ABS: 1.1 10*3/uL (ref 0.7–4.0)
LYMPHS PCT: 28.5 % (ref 12.0–46.0)
MCHC: 33.8 g/dL (ref 30.0–36.0)
MCV: 88.6 fl (ref 78.0–100.0)
MONOS PCT: 5.5 % (ref 3.0–12.0)
Monocytes Absolute: 0.2 10*3/uL (ref 0.1–1.0)
NEUTROS PCT: 63.9 % (ref 43.0–77.0)
Neutro Abs: 2.4 10*3/uL (ref 1.4–7.7)
Platelets: 203 10*3/uL (ref 150.0–400.0)
RBC: 4.89 Mil/uL (ref 3.87–5.11)
RDW: 13.5 % (ref 11.5–15.5)
WBC: 3.8 10*3/uL — AB (ref 4.0–10.5)

## 2015-06-16 LAB — C-REACTIVE PROTEIN: CRP: 0.2 mg/dL — AB (ref 0.5–20.0)

## 2015-06-16 LAB — T4, FREE: FREE T4: 0.94 ng/dL (ref 0.60–1.60)

## 2015-06-16 LAB — POCT URINALYSIS DIP (MANUAL ENTRY)
Bilirubin, UA: NEGATIVE
GLUCOSE UA: NEGATIVE
Ketones, POC UA: NEGATIVE
LEUKOCYTES UA: NEGATIVE
NITRITE UA: NEGATIVE
Protein Ur, POC: NEGATIVE
RBC UA: NEGATIVE
Spec Grav, UA: 1.015
UROBILINOGEN UA: 0.2
pH, UA: 7

## 2015-06-16 LAB — TSH: TSH: 0.48 u[IU]/mL (ref 0.35–4.50)

## 2015-06-16 LAB — SEDIMENTATION RATE: SED RATE: 13 mm/h (ref 0–22)

## 2015-06-16 NOTE — Patient Instructions (Signed)
Consideration of the numbness being a carpal tunnel or compressive numbness. The joint pain could be osteoarthritis or other. The one finger sounds like a trigger finger. Okay to take Aleve once a day if needed in the short run. Laboratory studies today screening for inflammation. Will be contacted about referral to a hand specialist i.e. hand surgeon to evaluate your symptoms.

## 2015-06-16 NOTE — Progress Notes (Signed)
Pre visit review using our clinic review tool, if applicable. No additional management support is needed unless otherwise documented below in the visit note.  Chief Complaint  Patient presents with  . Joint Pain    Both hands have pain and sometimes numbness.  Also has pain in her rt shoulder.    . Rt Shoulder Pain  . Bilateral Hand Numbness    HPI: Patient Christina Wilkins  comes in today  for  new problem evaluation. She has been having problems over the last 4-5 months of bilateral hand numbness in the evening during sleep. She doesn't think is positional and it hasn't gone away. She also has noted some pain and stiffness in her finger joints PIP area right more than left without associated injury. She has taken Aleve once a day which does help the joint pain. She also has some discomfort in her right shoulder but no specific injury. Denies any acute weakness but sometimes thinks that her right arm isn't as strong. Denies any head or neck pain radiation.  ROS: See pertinent positives and negatives per HPI. Her right fourth finger sometimes gets stuck in clicks Family history of arthritis in hands but not rheumatoid or autoimmune. Past Medical History  Diagnosis Date  . Allergic rhinitis     seasonal summer cough takes claritin  . Varicella     Family History  Problem Relation Age of Onset  . Breast cancer Mother   . Hypothyroidism Mother   . Alcohol abuse Father   . Hypertension Father     History   Social History  . Marital Status: Single    Spouse Name: N/A  . Number of Children: N/A  . Years of Education: N/A   Social History Main Topics  . Smoking status: Never Smoker   . Smokeless tobacco: Never Used  . Alcohol Use: Yes     Comment: per pt a beer once a week   . Drug Use: Not on file  . Sexual Activity: Not on file   Other Topics Concern  . Not on file   Social History Narrative   Single pharmacist   Regular Exercise- yes   hh of 3   Two adopted children      Outpatient Prescriptions Prior to Visit  Medication Sig Dispense Refill  . calcium carbonate (OS-CAL) 600 MG TABS Take 600 mg by mouth 2 (two) times daily with a meal.    . citalopram (CELEXA) 20 MG tablet TAKE 1 TABLET BY MOUTH EVERY DAY 90 tablet 3  . glucosamine-chondroitin 500-400 MG tablet Take 1 tablet by mouth 3 (three) times daily.    Marland Kitchen loratadine (CLARITIN) 10 MG tablet Take 10 mg by mouth daily.    . Multiple Vitamin (MULTIVITAMIN) tablet Take 1 tablet by mouth daily.    Marland Kitchen zolpidem (AMBIEN) 10 MG tablet Take 1 tablet (10 mg total) by mouth at bedtime as needed. 30 tablet 0   No facility-administered medications prior to visit.     EXAM:  BP 126/70 mmHg  Temp(Src) 98.6 F (37 C) (Oral)  Ht 5\' 7"  (1.702 m)  Wt 147 lb 3.2 oz (66.769 kg)  BMI 23.05 kg/m2  Body mass index is 23.05 kg/(m^2).  GENERAL: vitals reviewed and listed above, alert, oriented, appears well hydrated and in no acute distress HEENT: atraumatic, conjunctiva  clear, no obvious abnormalities on inspection of external nose and ears NECK: no obvious masses on inspection palpation  LUNGS: clear to auscultation bilaterally, no wheezes, rales  or rhonchi Neurologic exam appears to be intact with no gross atrophy. DTRs are present throughout. Grip strength is 5 out of 5. Pulses are normal in hands. There is some mild swelling at the PIP and distal joint but no focal tenderness no redness or warmth. CV: HRRR, no clubbing cyanosis or  peripheral edema nl cap refill   PSYCH: pleasant and cooperative, no obvious depression or anxiety Lab Results  Component Value Date   WBC 4.2 07/18/2014   HGB 14.2 07/18/2014   HCT 42.4 07/18/2014   PLT 181.0 07/18/2014   GLUCOSE 83 07/18/2014   CHOL 195 07/18/2014   TRIG 57.0 07/18/2014   HDL 91.10 07/18/2014   LDLCALC 93 07/18/2014   ALT 14 07/18/2014   AST 20 07/18/2014   NA 140 07/18/2014   K 4.2 07/18/2014   CL 106 07/18/2014   CREATININE 0.9 07/18/2014   BUN 18  07/18/2014   CO2 25 07/18/2014   TSH 1.07 07/18/2014    ASSESSMENT AND PLAN:  Discussed the following assessment and plan:  Paresthesia of both hands - Plan: Basic metabolic panel, CBC with Differential/Platelet, TSH, T4, free, Hemoglobin A1c, Hepatic function panel, ANA, Cyclic citrul peptide antibody, IgG, Sedimentation rate, C-reactive protein, POCT urinalysis dipstick, Ambulatory referral to Hand Surgery  Hand joint pain, unspecified laterality - Plan: Basic metabolic panel, CBC with Differential/Platelet, TSH, T4, free, Hemoglobin A1c, Hepatic function panel, ANA, Cyclic citrul peptide antibody, IgG, Sedimentation rate, C-reactive protein, POCT urinalysis dipstick Don't think she has systemic inflammatory arthritis but we'll do some screening labs. Her hands, carpal tunnel but without pain. It is persisting and she is wondering if there could be some weakness. The arthritis in her hands may be osteoarthritis. Advise lab tests and hand surgery consult to look at hand pain and hand numbness. Follow-up depending on this evaluation. -Patient advised to return or notify health care team  if symptoms worsen ,persist or new concerns arise.  Patient Instructions  Consideration of the numbness being a carpal tunnel or compressive numbness. The joint pain could be osteoarthritis or other. The one finger sounds like a trigger finger. Okay to take Aleve once a day if needed in the short run. Laboratory studies today screening for inflammation. Will be contacted about referral to a hand specialist i.e. hand surgeon to evaluate your symptoms.     Neta Mends. Panosh M.D.

## 2015-06-19 LAB — CYCLIC CITRUL PEPTIDE ANTIBODY, IGG

## 2015-06-19 LAB — ANA: Anti Nuclear Antibody(ANA): NEGATIVE

## 2015-06-22 ENCOUNTER — Encounter: Payer: Self-pay | Admitting: Internal Medicine

## 2015-07-30 ENCOUNTER — Encounter: Payer: Self-pay | Admitting: Internal Medicine

## 2015-08-02 ENCOUNTER — Encounter: Payer: Self-pay | Admitting: Internal Medicine

## 2015-08-13 ENCOUNTER — Encounter: Payer: Self-pay | Admitting: Internal Medicine

## 2015-08-17 ENCOUNTER — Other Ambulatory Visit: Payer: Self-pay | Admitting: Family Medicine

## 2015-08-17 ENCOUNTER — Other Ambulatory Visit: Payer: Self-pay | Admitting: Internal Medicine

## 2015-08-21 ENCOUNTER — Ambulatory Visit (INDEPENDENT_AMBULATORY_CARE_PROVIDER_SITE_OTHER): Payer: 59

## 2015-08-21 DIAGNOSIS — Z23 Encounter for immunization: Secondary | ICD-10-CM | POA: Diagnosis not present

## 2015-09-11 ENCOUNTER — Encounter: Payer: Self-pay | Admitting: Internal Medicine

## 2015-09-22 ENCOUNTER — Encounter: Payer: Self-pay | Admitting: Internal Medicine

## 2015-09-22 ENCOUNTER — Other Ambulatory Visit: Payer: Self-pay

## 2015-09-22 DIAGNOSIS — Z1231 Encounter for screening mammogram for malignant neoplasm of breast: Secondary | ICD-10-CM

## 2015-09-26 ENCOUNTER — Ambulatory Visit
Admission: RE | Admit: 2015-09-26 | Discharge: 2015-09-26 | Disposition: A | Discharge status: Home / Self Care | Payer: 59 | Source: Ambulatory Visit

## 2015-09-26 DIAGNOSIS — Z1231 Encounter for screening mammogram for malignant neoplasm of breast: Secondary | ICD-10-CM

## 2015-11-12 ENCOUNTER — Other Ambulatory Visit: Payer: Self-pay | Admitting: Internal Medicine

## 2015-12-04 ENCOUNTER — Encounter: Payer: Self-pay | Admitting: Internal Medicine

## 2016-01-16 ENCOUNTER — Encounter: Payer: Self-pay | Admitting: Internal Medicine

## 2016-02-05 ENCOUNTER — Encounter: Payer: Self-pay | Admitting: Internal Medicine

## 2016-02-06 MED ORDER — PROMETHAZINE-CODEINE 6.25-10 MG/5ML PO SYRP: 5.0000 mL | ORAL_SOLUTION | Freq: Four times a day (QID) | ORAL | Status: DC | PRN

## 2016-02-06 NOTE — Telephone Encounter (Signed)
Ok to refill the promethazine with codiene 180 as  Requested

## 2016-02-13 ENCOUNTER — Other Ambulatory Visit: Payer: Self-pay | Admitting: Internal Medicine

## 2016-04-15 ENCOUNTER — Encounter: Payer: Self-pay | Admitting: Internal Medicine

## 2016-05-22 ENCOUNTER — Other Ambulatory Visit: Payer: Self-pay | Admitting: Internal Medicine

## 2016-05-23 NOTE — Telephone Encounter (Signed)
Last OV 06/16/15... Last filled 02/12/15, #90 with 0 refills... No future OV scheduled ... Okay to refill?

## 2016-05-24 NOTE — Telephone Encounter (Signed)
Ok to refill  Have her get appt  Before runs out.  She was here with her daughter today .

## 2016-05-27 NOTE — Telephone Encounter (Signed)
lmom for pt to call back

## 2016-06-12 ENCOUNTER — Encounter: Payer: Self-pay | Admitting: Internal Medicine

## 2016-07-01 ENCOUNTER — Encounter: Payer: Self-pay | Admitting: Internal Medicine

## 2016-07-03 NOTE — Telephone Encounter (Signed)
I sent this message    I looked at the list  And dont know most of the providers   However I strongly suggest  a female provider  Who is  certified  based on knowing  Christina Wilkins  And her situation.   Dr Phillip HealJane Steiner is a child psych  And sees some adults and if taking new patients  Her age would be good.  Dr  Rutherford Limerickadepalli  And Michae KavaAgarwal  And Daleen BoRavi  Are  female at behavioral health and other practices  but  I dont know  Them personally

## 2016-08-24 ENCOUNTER — Other Ambulatory Visit: Payer: Self-pay | Admitting: Internal Medicine

## 2016-08-27 NOTE — Telephone Encounter (Signed)
Left a message for a return call.  Pt needs a follow up appointment for further refills of medication.

## 2016-08-28 NOTE — Telephone Encounter (Signed)
Spoke to the pt.  She stated that she does not need a refill of medication at this time.  Medication has been denied.

## 2016-09-06 ENCOUNTER — Other Ambulatory Visit: Payer: Self-pay | Admitting: Internal Medicine

## 2016-09-06 DIAGNOSIS — Z1231 Encounter for screening mammogram for malignant neoplasm of breast: Secondary | ICD-10-CM

## 2016-09-09 ENCOUNTER — Encounter: Payer: Self-pay | Admitting: Internal Medicine

## 2016-09-26 ENCOUNTER — Ambulatory Visit
Admission: RE | Admit: 2016-09-26 | Discharge: 2016-09-26 | Disposition: A | Discharge status: Home / Self Care | Payer: 59 | Source: Ambulatory Visit | Attending: Internal Medicine | Admitting: Internal Medicine

## 2016-09-26 DIAGNOSIS — Z1231 Encounter for screening mammogram for malignant neoplasm of breast: Secondary | ICD-10-CM

## 2016-10-07 ENCOUNTER — Ambulatory Visit (INDEPENDENT_AMBULATORY_CARE_PROVIDER_SITE_OTHER): Payer: 59 | Admitting: Family Medicine

## 2016-10-07 ENCOUNTER — Encounter: Payer: Self-pay | Admitting: Internal Medicine

## 2016-10-07 DIAGNOSIS — Z23 Encounter for immunization: Secondary | ICD-10-CM | POA: Diagnosis not present

## 2016-10-22 ENCOUNTER — Encounter: Payer: Self-pay | Admitting: Internal Medicine

## 2016-12-30 ENCOUNTER — Encounter: Payer: Self-pay | Admitting: Internal Medicine

## 2017-02-10 ENCOUNTER — Encounter: Payer: Self-pay | Admitting: Internal Medicine

## 2017-02-11 NOTE — Telephone Encounter (Signed)
misty ok to do this   For aliana    Please help

## 2017-03-05 ENCOUNTER — Encounter: Payer: Self-pay | Admitting: Internal Medicine

## 2017-06-19 ENCOUNTER — Encounter: Payer: Self-pay | Admitting: Internal Medicine

## 2017-06-20 ENCOUNTER — Other Ambulatory Visit: Payer: Self-pay | Admitting: Emergency Medicine

## 2017-06-20 MED ORDER — ZOLPIDEM TARTRATE 5 MG PO TABS: 5.0000 mg | ORAL_TABLET | Freq: Every evening | ORAL | 0 refills | Status: DC | PRN

## 2017-06-20 NOTE — Telephone Encounter (Signed)
Ok to [employer] amb[facility] 5 mg 1 po hs prn sleep disp 30 no refills

## 2017-09-08 ENCOUNTER — Ambulatory Visit (INDEPENDENT_AMBULATORY_CARE_PROVIDER_SITE_OTHER): Payer: 59 | Admitting: *Deleted

## 2017-09-08 DIAGNOSIS — Z23 Encounter for immunization: Secondary | ICD-10-CM | POA: Diagnosis not present

## 2017-09-08 NOTE — Addendum Note (Signed)
Addended by: MARTIN, MISTY L on: 09/08/2017 10:06 AM   Modules accepted: Level of Service  

## 2017-09-12 ENCOUNTER — Encounter: Payer: Self-pay | Admitting: Internal Medicine

## 2017-09-24 ENCOUNTER — Other Ambulatory Visit: Payer: Self-pay | Admitting: Internal Medicine

## 2017-09-24 DIAGNOSIS — Z1231 Encounter for screening mammogram for malignant neoplasm of breast: Secondary | ICD-10-CM

## 2017-10-03 ENCOUNTER — Ambulatory Visit
Admission: RE | Admit: 2017-10-03 | Discharge: 2017-10-03 | Disposition: A | Discharge status: Home / Self Care | Payer: 59 | Source: Ambulatory Visit | Attending: Internal Medicine | Admitting: Internal Medicine

## 2017-10-03 DIAGNOSIS — Z1231 Encounter for screening mammogram for malignant neoplasm of breast: Secondary | ICD-10-CM | POA: Diagnosis not present

## 2017-10-20 ENCOUNTER — Encounter: Payer: Self-pay | Admitting: Internal Medicine

## 2017-12-08 DIAGNOSIS — L814 Other melanin hyperpigmentation: Secondary | ICD-10-CM | POA: Diagnosis not present

## 2017-12-08 DIAGNOSIS — L821 Other seborrheic keratosis: Secondary | ICD-10-CM | POA: Diagnosis not present

## 2017-12-08 DIAGNOSIS — L570 Actinic keratosis: Secondary | ICD-10-CM | POA: Diagnosis not present

## 2017-12-15 ENCOUNTER — Other Ambulatory Visit: Payer: Self-pay | Admitting: Internal Medicine

## 2017-12-17 NOTE — Telephone Encounter (Signed)
Last office visit with me that I see was  June 2016. Please have her schedule OV  For med evaluation  In the interim can send in 10#  pillls of ambien  Same sig

## 2017-12-18 ENCOUNTER — Telehealth: Payer: Self-pay | Admitting: Emergency Medicine

## 2017-12-18 NOTE — Telephone Encounter (Signed)
Left a VM for patient to give the office a call regarding needing to schedule a medication evaluation appointment with PCP.  Patient medication has been sent to the pharmacy Zolpidem with only 10 tabs and no refills.

## 2018-01-02 NOTE — Progress Notes (Signed)
Chief Complaint  Patient presents with  . Medication Refill    Ambien. No problems/no concerns    HPI: Christina Wilkins 62 y.o. come in formed management   ambien   Not  Filled  muchj and when travels  And then  No 2 nights in a row.   Traveling  With job an other  No sig se   Last visit  2017 and doing well  utd on flu vaccine.   Refill  promethizine   With codiene still has  A few left over from  2017 but has on hand for  Bad cough nights in family  ROS: See pertinent positives and negatives per HPI. No cv pulm sx  change in  Health   Past Medical History:  Diagnosis Date  . Allergic rhinitis    seasonal summer cough takes claritin  . Varicella     Family History  Problem Relation Age of Onset  . Breast cancer Mother   . Hypothyroidism Mother   . Alcohol abuse Father   . Hypertension Father     Social History   Socioeconomic History  . Marital status: Single    Spouse name: None  . Number of children: None  . Years of education: None  . Highest education level: None  Social Needs  . Financial resource strain: None  . Food insecurity - worry: None  . Food insecurity - inability: None  . Transportation needs - medical: None  . Transportation needs - non-medical: None  Occupational History  . None  Tobacco Use  . Smoking status: Never Smoker  . Smokeless tobacco: Never Used  Substance and Sexual Activity  . Alcohol use: Yes    Comment: per pt a beer once a week   . Drug use: None  . Sexual activity: None  Other Topics Concern  . None  Social History Narrative   Single pharmacist   Regular Exercise- yes   hh of 3   Two adopted children     Outpatient Medications Prior to Visit  Medication Sig Dispense Refill  . calcium carbonate (OS-CAL) 600 MG TABS Take 600 mg by mouth 2 (two) times daily with a meal.    . glucosamine-chondroitin 500-400 MG tablet Take 1 tablet by mouth 3 (three) times daily.    Marland Kitchen. loratadine (CLARITIN) 10 MG tablet Take 10 mg by  mouth daily.    . Multiple Vitamin (MULTIVITAMIN) tablet Take 1 tablet by mouth daily.    . citalopram (CELEXA) 20 MG tablet TAKE ONE TABLET BY MOUTH EVERY DAY 90 tablet 0  . promethazine-codeine (PHENERGAN WITH CODEINE) 6.25-10 MG/5ML syrup Take 5 mLs by mouth every 6 (six) hours as needed for cough. 180 mL 0  . zolpidem (AMBIEN) 5 MG tablet TAKE 1 TABLET BY MOUTH AT BEDTIME AS NEEDED FOR SLEEP 10 tablet 0   No facility-administered medications prior to visit.      EXAM:  BP 104/62 (BP Location: Right Arm, Patient Position: Sitting, Cuff Size: Normal)   Pulse 79   Temp 98.2 F (36.8 C) (Oral)   Wt 151 lb (68.5 kg)   BMI 23.65 kg/m   Body mass index is 23.65 kg/m.  GENERAL: vitals reviewed and listed above, alert, oriented, appears well hydrated and in no acute distress HEENT: atraumatic, conjunctiva  clear, no obvious abnormalities on inspection of external nose and ears OP : no lesion edema or exudate  NECK: no obvious masses on inspection palpation  LUNGS: clear to auscultation  bilaterally, no wheezes, rales or rhonchi, good air movement CV: HRRR, no clubbing cyanosis or  peripheral edema nl cap refill  MS: moves all extremities without noticeable focal  abnormality PSYCH: pleasant and cooperative, no obvious depression or anxiety Lab Results  Component Value Date   WBC 3.8 (L) 06/16/2015   HGB 14.6 06/16/2015   HCT 43.3 06/16/2015   PLT 203.0 06/16/2015   GLUCOSE 72 06/16/2015   CHOL 195 07/18/2014   TRIG 57.0 07/18/2014   HDL 91.10 07/18/2014   LDLCALC 93 07/18/2014   ALT 15 06/16/2015   AST 19 06/16/2015   NA 138 06/16/2015   K 4.7 06/16/2015   CL 104 06/16/2015   CREATININE 0.88 06/16/2015   BUN 20 06/16/2015   CO2 33 (H) 06/16/2015   TSH 0.48 06/16/2015   HGBA1C 5.3 06/16/2015   BP Readings from Last 3 Encounters:  01/05/18 104/62  06/16/15 126/70  07/27/14 106/80    ASSESSMENT AND PLAN:  Discussed the following assessment and plan:  Sleep  disturbance w travel  Medication management - .benefit more than risk  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 ok to refill   Patient travels utd on  mammo  Disc colon screen when due and shingrix  -Patient advised to return or notify health care team  if  new concerns arise.  Patient Instructions  Glad you  Are doing well .   Continue as discussed .   Consider cologuard   When due for colon screen .   Yearly ov for med check  ora s needed  Consider lab work .   At 62 years.     Neta Mends. Panosh M.D.

## 2018-01-05 ENCOUNTER — Encounter: Payer: Self-pay | Admitting: Internal Medicine

## 2018-01-05 ENCOUNTER — Ambulatory Visit: Payer: 59 | Admitting: Internal Medicine

## 2018-01-05 VITALS — BP 104/62 | HR 79 | Temp 98.2°F | Wt 151.0 lb

## 2018-01-05 DIAGNOSIS — Z789 Other specified health status: Secondary | ICD-10-CM

## 2018-01-05 DIAGNOSIS — Z79899 Other long term (current) drug therapy: Secondary | ICD-10-CM

## 2018-01-05 DIAGNOSIS — G479 Sleep disorder, unspecified: Secondary | ICD-10-CM

## 2018-01-05 MED ORDER — PROMETHAZINE-CODEINE 6.25-10 MG/5ML PO SYRP: 5.0000 mL | ORAL_SOLUTION | Freq: Four times a day (QID) | ORAL | 0 refills | Status: DC | PRN

## 2018-01-05 MED ORDER — ZOLPIDEM TARTRATE 5 MG PO TABS: 5.0000 mg | ORAL_TABLET | Freq: Every evening | ORAL | 2 refills | Status: DC | PRN

## 2018-01-05 NOTE — Patient Instructions (Addendum)
Glad you  Are doing well .   Continue as discussed .   Consider cologuard   When due for colon screen .   Yearly ov for med check  ora s needed  Consider lab work .   At 62 years.

## 2018-02-24 NOTE — Progress Notes (Signed)
Christina Wilkins [address] [place], [place] [address] Phone: [phone] Subjective:    I'm seeing this patient by the request  of:  Panosh, Neta Mends, MD   CC: shoulder and knee pain   [id number]  Christina Wilkins is a 62 y.o. female coming in with complaint of right Shoulder pain. She has been having pain for the past 6 months. Patient has pain on anterior shoulder. She has decreased her physical activity when it comes to shoulders. Pain is intermittent. Patient has a hard time putting on a jacket. Denies any numbness or tingling in hand.  Patient rates the severity of pain is 5 out of 10.  Has tried some over-the-counter medications such as ibuprofen that is helpful.  Patient also has knee pain. She was a runner. She does not run any more. She did some yoga which seemed to help alleviate her pain. In December she got to a point where she was unable to bend her knee. She said that this occurred 3 days after a run. She did ice it and one day she woke up and her range of motion had improved. Patient also has pain on the left knee on the lateral portion. She feels a popping at the knee joint which she describes feeling like a rubber band.  Denies swelling or giving out on her.  States that if she tries to increase activity has some difficulty.      Past Medical History:  Diagnosis Date  . Allergic rhinitis    seasonal summer cough takes claritin  . Varicella    Past Surgical History:  Procedure Laterality Date  . BUNIONECTOMY    . right eye scar tissue on cornea removed  April 2011   Social History   Socioeconomic History  . Marital status: Single    Spouse name: Not on file  . Number of children: Not on file  . Years of education: Not on file  . Highest education level: Not on file  Social Needs  . Financial resource strain: Not on file  . Food insecurity - worry: Not on file  . Food insecurity - inability: Not on file  . Transportation needs -  medical: Not on file  . Transportation needs - non-medical: Not on file  Occupational History  . Not on file  Tobacco Use  . Smoking status: Never Smoker  . Smokeless tobacco: Never Used  Substance and Sexual Activity  . Alcohol use: Yes    Comment: per pt a beer once a week   . Drug use: Not on file  . Sexual activity: Not on file  Other Topics Concern  . Not on file  Social History Narrative   Single pharmacist   Regular Exercise- yes   hh of 3   Two adopted children    No Known Allergies Family History  Problem Relation Age of Onset  . Breast cancer Mother   . Hypothyroidism Mother   . Alcohol abuse Father   . Hypertension Father      Past medical history, social, surgical and family history all reviewed in electronic medical record.  No pertanent information unless stated regarding to the chief complaint.   Review of Systems:Review of systems updated and as accurate as of 02/24/18  No headache, visual changes, nausea, vomiting, diarrhea, constipation, dizziness, abdominal pain, skin rash, fevers, chills, night sweats, weight loss, swollen lymph nodes, body aches, joint swelling, muscle aches, chest pain, shortness of breath, mood changes.  Objective  There were no vitals taken for this visit. Systems examined below as of 02/24/18   General: No apparent distress alert and oriented x3 mood and affect normal, dressed appropriately.  HEENT: Pupils equal, extraocular movements intact  Respiratory: Patient's speak in full sentences and does not appear short of breath  Cardiovascular: No lower extremity edema, non tender, no erythema  Skin: Warm dry intact with no signs of infection or rash on extremities or on axial skeleton.  Abdomen: Soft nontender  Neuro: Cranial nerves II through XII are intact, neurovascularly intact in all extremities with 2+ DTRs and 2+ pulses.  Lymph: No lymphadenopathy of posterior or anterior cervical chain or axillae bilaterally.  Gait normal  with good balance and coordination.  MSK:  Non tender with full range of motion and good stability and symmetric strength and tone of  elbows, wrist, hip, knee and ankles bilaterally.  Shoulder: Right Inspection reveals no abnormalities, atrophy or asymmetry. Palpation is normal with no tenderness over AC joint or bicipital groove. ROM is full in all planes. Rotator cuff strength normal throughout. No signs of impingement with negative Neer and Hawkin's tests, empty can sign. Speeds and Yergason's tests positive. No labral pathology noted with negative Obrien's, negative clunk and good stability. Normal scapular function observed. No painful arc and no drop arm sign. No apprehension sign Contralateral shoulder unremarkable  Right knee exam shows the patient has full range of motion.  Mild patella femoral pain.  Patient has mild crepitus.  Cyst noted over the lateral aspect of the knee.  MSK US performed of: Right This study was ordered, performed, and interpreted by Terrilee FilesZach Keyonna Comunale D.O.  Shoulder:   Supraspinatus:  Appears normal on long and transverse views, mild bursal bulge seen with shoulder abduction on impingement view. Subscapularis:  Appears normal on long and transverse views. AC joint: Mild arthritis with calcific changes Glenohumeral Joint:  Appears normal without effusion. Glenoid Labrum:  Intact without visualized tears. Biceps Tendon:  Appears hypoechoic changes with chronic subluxation Impression: Biceps subluxation with hypoechoic changes  [id number]; 15 additional minutes spent for Therapeutic exercises as stated in above notes.  This included exercises focusing on stretching, strengthening, with significant focus on eccentric aspects.   Long term goals include an improvement in range of motion, strength, endurance as well as avoiding reinjury. Patient's frequency would include in 1-2 times a day, 3-5 times a week for a duration of 6-12 weeks. Shoulder Exercises that included:    Basic scapular stabilization to include adduction and depression of scapula Scaption, focusing on proper movement and good control Internal and External rotation utilizing a theraband, with elbow tucked at side entire time Rows with theraband  Which was given   Proper technique shown and discussed handout in great detail with ATC.  All questions were discussed and answered.     Impression and Recommendations:     This case required medical decision making of moderate complexity.      Note: This dictation was prepared with Dragon dictation along with smaller phrase technology. Any transcriptional errors that result from this process are unintentional.

## 2018-02-25 ENCOUNTER — Ambulatory Visit: Payer: Self-pay

## 2018-02-25 ENCOUNTER — Ambulatory Visit: Payer: 59 | Admitting: Family Medicine

## 2018-02-25 ENCOUNTER — Encounter: Payer: Self-pay | Admitting: Family Medicine

## 2018-02-25 VITALS — BP 118/68 | HR 95 | Ht 67.0 in | Wt 153.0 lb

## 2018-02-25 DIAGNOSIS — G8929 Other chronic pain: Secondary | ICD-10-CM | POA: Diagnosis not present

## 2018-02-25 DIAGNOSIS — M25511 Pain in right shoulder: Principal | ICD-10-CM

## 2018-02-25 DIAGNOSIS — M25861 Other specified joint disorders, right knee: Secondary | ICD-10-CM | POA: Diagnosis not present

## 2018-02-25 DIAGNOSIS — S43003A Unspecified subluxation of unspecified shoulder joint, initial encounter: Secondary | ICD-10-CM

## 2018-02-25 DIAGNOSIS — S46119A Strain of muscle, fascia and tendon of long head of biceps, unspecified arm, initial encounter: Secondary | ICD-10-CM | POA: Diagnosis not present

## 2018-02-25 NOTE — Patient Instructions (Signed)
Good to see you  Right bicep subluxation and little cyst on the knee we will watch pennsaid pinkie amount topically 2 times daily as needed.  Arm compression sleeve can help with activity  Exercises 3 times a week.   Keep hands within peripheral vision  Vitamin D once a week for 8 weeks then 2000IU daily thereafter.  Ice 20 minutes 2 times daily. Usually after activity and before bed. Turmeric 500mg  daily to help with arthritis Consider tart cherry extract pill form any dose at night as well for arthritis pain See me again in 4 weeks

## 2018-02-25 NOTE — Assessment & Plan Note (Signed)
Patient does have more of a biceps subluxation, discussed compression, home exercise, topical anti-inflammatories, vitamin D supplementation for muscle strength and endurance.  Worsening symptoms we will consider formal physical therapy or injection.  Follow-up again in 4 weeks

## 2018-02-25 NOTE — Assessment & Plan Note (Signed)
If worsening symptoms consider aspiration.  Patient wants to try conservative therapy first.

## 2018-02-26 ENCOUNTER — Ambulatory Visit: Payer: 59 | Admitting: Family Medicine

## 2018-02-28 ENCOUNTER — Encounter: Payer: Self-pay | Admitting: Family Medicine

## 2018-03-25 ENCOUNTER — Ambulatory Visit: Payer: 59 | Admitting: Family Medicine

## 2018-03-30 NOTE — Progress Notes (Signed)
[name] [address] [place], [place] [address] Phone: [phone] Subjective:    I'm seeing this patient by the request  of:    CC: Right knee pain, right shoulder pain follow-up  BJY:[id number]  Christina Wilkins is a 62 y.o. female coming in with complaint of right knee pain.  Found to have a lateral cyst.  Seem to be more ganglion.  Still there.  Seems to be tender and getting in the way.  Not growing.  Patient would like potential aspiration  Right shoulder.  Patient has been noncompliant.  Not doing the exercises.  Still having the pain.  Seems to be worse in the shaft.      Past Medical History:  Diagnosis Date  . Allergic rhinitis    seasonal summer cough takes claritin  . Varicella    Past Surgical History:  Procedure Laterality Date  . BUNIONECTOMY    . right eye scar tissue on cornea removed  April 2011   Social History   Socioeconomic History  . Marital status: Single    Spouse name: Not on file  . Number of children: Not on file  . Years of education: Not on file  . Highest education level: Not on file  Occupational History  . Not on file  Social Needs  . Financial resource strain: Not on file  . Food insecurity:    Worry: Not on file    Inability: Not on file  . Transportation needs:    Medical: Not on file    Non-medical: Not on file  Tobacco Use  . Smoking status: Never Smoker  . Smokeless tobacco: Never Used  Substance and Sexual Activity  . Alcohol use: Yes    Comment: per pt a beer once a week   . Drug use: Not on file  . Sexual activity: Not on file  Lifestyle  . Physical activity:    Days per week: Not on file    Minutes per session: Not on file  . Stress: Not on file  Relationships  . Social connections:    Talks on phone: Not on file    Gets together: Not on file    Attends religious service: Not on file    Active member of club or organization: Not on file    Attends meetings of clubs or  organizations: Not on file    Relationship status: Not on file  Other Topics Concern  . Not on file  Social History Narrative   Single pharmacist   Regular Exercise- yes   hh of 3   Two adopted children    No Known Allergies Family History  Problem Relation Age of Onset  . Breast cancer Mother   . Hypothyroidism Mother   . Alcohol abuse Father   . Hypertension Father      Past medical history, social, surgical and family history all reviewed in electronic medical record.  No pertanent information unless stated regarding to the chief complaint.   Review of Systems:Review of systems updated and as accurate as of 03/31/18  No headache, visual changes, nausea, vomiting, diarrhea, constipation, dizziness, abdominal pain, skin rash, fevers, chills, night sweats, weight loss, swollen lymph nodes, body aches, , chest pain, shortness of breath, mood changes.  Positive joint swelling and muscle aches  Objective  Blood pressure 120/78, pulse 73, height 5\' 7"  (1.702 m), weight 154 lb (69.9 kg), SpO2 98 %. Systems examined below as of 03/31/18   General:  No apparent distress alert and oriented x3 mood and affect normal, dressed appropriately.  HEENT: Pupils equal, extraocular movements intact  Respiratory: Patient's speak in full sentences and does not appear short of breath  Cardiovascular: No lower extremity edema, non tender, no erythema  Skin: Warm dry intact with no signs of infection or rash on extremities or on axial skeleton.  Abdomen: Soft nontender  Neuro: Cranial nerves II through XII are intact, neurovascularly intact in all extremities with 2+ DTRs and 2+ pulses.  Lymph: No lymphadenopathy of posterior or anterior cervical chain or axillae bilaterally.  Gait normal with good balance and coordination.  MSK:  Non tender with full range of motion and good stability and symmetric strength and tone of , elbows, wrist, hip, and ankles bilaterally.   Right knee exam is full range of  motion but is tender over the lateral joint line.  Cyst noted.  No erythema or signs of infection.  Otherwise patient's knee is completely stable with very mild patella grind.  Contralateral knee unremarkable  Shoulder: Right Inspection reveals no abnormalities, atrophy or asymmetry. Palpation is normal with no tenderness over AC joint or bicipital groove. ROM is full in all planes. Rotator cuff strength normal throughout. Positive impingement Speeds and Yergason's tests positive. No labral pathology noted with negative Obrien's, negative clunk and good stability. Normal scapular function observed. No painful arc and no drop arm sign. No apprehension sign Contralateral shoulder unremarkable   Procedure: Real-time Ultrasound Guided Injection of right knee lateral cyst Device: GE Logiq Q7 Ultrasound guided injection is preferred based studies that show increased duration, increased effect, greater accuracy, decreased procedural pain, increased response rate, and decreased cost with ultrasound guided versus blind injection.  Verbal informed consent obtained.  Time-out conducted.  Noted no overlying erythema, induration, or other signs of local infection.  Skin prepped in a sterile fashion.  Local anesthesia: Topical Ethyl chloride.  With sterile technique and under real time ultrasound guidance: With a 22-gauge 2 inch needle patient was injected with 4 cc of 0.5% Marcaine and aspirated gel-like material then injected 1 cc of Kenalog 40 mg/dL. This was from a superior lateral approach.  Completed without difficulty  Pain immediately resolved suggesting accurate placement of the medication.  Advised to call if fevers/chills, erythema, induration, drainage, or persistent bleeding.  Images permanently stored and available for review in the ultrasound unit.  Impression: Technically successful ultrasound guided injection.   Impression and Recommendations:     This case required medical  decision making of moderate complexity.      Note: This dictation was prepared with Dragon dictation along with smaller phrase technology. Any transcriptional errors that result from this process are unintentional.

## 2018-03-31 ENCOUNTER — Encounter: Payer: Self-pay | Admitting: Family Medicine

## 2018-03-31 ENCOUNTER — Ambulatory Visit: Payer: 59 | Admitting: Family Medicine

## 2018-03-31 ENCOUNTER — Ambulatory Visit: Payer: Self-pay

## 2018-03-31 VITALS — BP 120/78 | HR 73 | Ht 67.0 in | Wt 154.0 lb

## 2018-03-31 DIAGNOSIS — M25569 Pain in unspecified knee: Principal | ICD-10-CM

## 2018-03-31 DIAGNOSIS — S46119A Strain of muscle, fascia and tendon of long head of biceps, unspecified arm, initial encounter: Secondary | ICD-10-CM

## 2018-03-31 DIAGNOSIS — M25861 Other specified joint disorders, right knee: Secondary | ICD-10-CM

## 2018-03-31 DIAGNOSIS — G8929 Other chronic pain: Secondary | ICD-10-CM | POA: Diagnosis not present

## 2018-03-31 DIAGNOSIS — S43003A Unspecified subluxation of unspecified shoulder joint, initial encounter: Secondary | ICD-10-CM

## 2018-03-31 MED ORDER — MELOXICAM 15 MG PO TABS: 15.0000 mg | ORAL_TABLET | Freq: Every day | ORAL | 0 refills | Status: DC

## 2018-03-31 NOTE — Assessment & Plan Note (Signed)
Patient has been noncompliant.  Discussed icing regimen and home exercises.  Discussed which activity to do patient will increase activity over the next several days declined formal physical therapy or injection.  Follow-up again in 3-6 weeks

## 2018-03-31 NOTE — Assessment & Plan Note (Signed)
Appears to be ganglion.  If reaccumulation advanced imaging would be warranted.  Discussed icing regimen and home exercises.  Follow-up with me again in 3-6 weeks

## 2018-03-31 NOTE — Patient Instructions (Addendum)
Good to see you  Ice is your friend Aspirated the knee today and lets watch it.  The shoulder try to do the exercises a little more diligently.  Arm compression with activity  Meloxicam daily for 10 days then as needed See me again in 3-6 weeks

## 2018-05-11 NOTE — Progress Notes (Signed)
[name] [address] [place], [place] [address] Phone: [phone] Subjective:     CC: Shoulder pain and knee pain follow-up  [id number]  Christina Wilkins is a 62 y.o. female coming in with complaint of right shoulder pain.  Was found to have a biceps subluxation.  Patient has been completely and utterly noncompliant she states.  Has not done the exercises at all.  Has not done a compression sleeve and has not done anything that was recommended.  Thinks that she should probably give it more time before she subsides on doing any more aggressive therapies.  Patient did have some right knee pain and did have a cyst on the lateral aspect of the knee that was drained.  Tolerated the procedure well.  Feeling significantly better at this time.  Minimal discomfort.     Past Medical History:  Diagnosis Date  . Allergic rhinitis    seasonal summer cough takes claritin  . Varicella    Past Surgical History:  Procedure Laterality Date  . BUNIONECTOMY    . right eye scar tissue on cornea removed  April 2011   Social History   Socioeconomic History  . Marital status: Single    Spouse name: Not on file  . Number of children: Not on file  . Years of education: Not on file  . Highest education level: Not on file  Occupational History  . Not on file  Social Needs  . Financial resource strain: Not on file  . Food insecurity:    Worry: Not on file    Inability: Not on file  . Transportation needs:    Medical: Not on file    Non-medical: Not on file  Tobacco Use  . Smoking status: Never Smoker  . Smokeless tobacco: Never Used  Substance and Sexual Activity  . Alcohol use: Yes    Comment: per pt a beer once a week   . Drug use: Not on file  . Sexual activity: Not on file  Lifestyle  . Physical activity:    Days per week: Not on file    Minutes per session: Not on file  . Stress: Not on file  Relationships  . Social connections:    Talks on  phone: Not on file    Gets together: Not on file    Attends religious service: Not on file    Active member of club or organization: Not on file    Attends meetings of clubs or organizations: Not on file    Relationship status: Not on file  Other Topics Concern  . Not on file  Social History Narrative   Single pharmacist   Regular Exercise- yes   hh of 3   Two adopted children    No Known Allergies Family History  Problem Relation Age of Onset  . Breast cancer Mother   . Hypothyroidism Mother   . Alcohol abuse Father   . Hypertension Father      Past medical history, social, surgical and family history all reviewed in electronic medical record.  No pertanent information unless stated regarding to the chief complaint.   Review of Systems:Review of systems updated and as accurate as of 05/12/18  No headache, visual changes, nausea, vomiting, diarrhea, constipation, dizziness, abdominal pain, skin rash, fevers, chills, night sweats, weight loss, swollen lymph nodes, body aches, joint swelling, chest pain, shortness of breath, mood changes.  Positive muscle aches  Objective  Blood pressure 102/70, pulse Marland Kitchen)  59, height  (1.702 m), weight 152 lb (68.9 kg), SpO2 98 %. Systems examined below as of 05/12/18   General: No apparent distress alert and oriented x3 mood and affect normal, dressed appropriately.  HEENT: Pupils equal, extraocular movements intact  Respiratory: Patient's speak in full sentences and does not appear short of breath  Cardiovascular: No lower extremity edema, non tender, no erythema  Skin: Warm dry intact with no signs of infection or rash on extremities or on axial skeleton.  Abdomen: Soft nontender  Neuro: Cranial nerves II through XII are intact, neurovascularly intact in all extremities with 2+ DTRs and 2+ pulses.  Lymph: No lymphadenopathy of posterior or anterior cervical chain or axillae bilaterally.  Gait normal with good balance and coordination.    MSK:  Non tender with full range of motion and good stability and symmetric strength and tone of  elbows, wrist, hip, knee and ankles bilaterally.  Shoulder: Right Inspection reveals no abnormalities, atrophy or asymmetry. Palpation mild diffuse tenderness ROM is full in all planes. Rotator cuff strength normal throughout. Positive impingement Speeds and Yergason's tests normal. No labral pathology noted with negative Obrien's, negative clunk and good stability. Normal scapular function observed. No painful arc and no drop arm sign. No apprehension sign Contralateral shoulder unremarkable  Patient's right knee on the lateral aspect still has a small assessment doing much better.  Does have a positive patellar grind still noted.       Impression and Recommendations:     This case required medical decision making of moderate complexity.      Note: This dictation was prepared with Dragon dictation along with smaller phrase technology. Any transcriptional errors that result from this process are unintentional.

## 2018-05-12 ENCOUNTER — Ambulatory Visit: Payer: 59 | Admitting: Family Medicine

## 2018-05-12 ENCOUNTER — Encounter: Payer: Self-pay | Admitting: Family Medicine

## 2018-05-12 DIAGNOSIS — S43003A Unspecified subluxation of unspecified shoulder joint, initial encounter: Secondary | ICD-10-CM

## 2018-05-12 DIAGNOSIS — M25861 Other specified joint disorders, right knee: Secondary | ICD-10-CM | POA: Diagnosis not present

## 2018-05-12 DIAGNOSIS — S46119A Strain of muscle, fascia and tendon of long head of biceps, unspecified arm, initial encounter: Secondary | ICD-10-CM

## 2018-05-12 MED ORDER — MELOXICAM 15 MG PO TABS: 15.0000 mg | ORAL_TABLET | Freq: Every day | ORAL | 1 refills | Status: DC

## 2018-05-12 NOTE — Patient Instructions (Signed)
Good to see you  You will do well  Exercises 3 times a week.   Keep hands within peripheral vision  Try to wear the compression  Refilled meloxicam and use for 5-7 days when needed See me again in 2 months

## 2018-05-12 NOTE — Assessment & Plan Note (Signed)
Patient responded well to the aspiration home exercise. Patient will continue with conservative therapy and monitor.  Worsening symptoms we will consider aspiration again and advanced imaging if it comes back very soon.

## 2018-05-12 NOTE — Assessment & Plan Note (Signed)
Encourage patient to try the conservative therapy that was recommended including compression exam, home exercise, and icing regimen.  Patient has declined formal physical therapy or injection previously.  Patient tried again for 6 weeks

## 2018-08-27 ENCOUNTER — Ambulatory Visit: Payer: 59 | Admitting: Family Medicine

## 2018-08-27 ENCOUNTER — Encounter: Payer: Self-pay | Admitting: Family Medicine

## 2018-08-27 DIAGNOSIS — M25861 Other specified joint disorders, right knee: Secondary | ICD-10-CM | POA: Diagnosis not present

## 2018-08-27 NOTE — Patient Instructions (Signed)
Nice to meet you  Please try compression for you knees  Please try to ice them  Please follow up when you return from your trip if you have any further problems.

## 2018-08-27 NOTE — Progress Notes (Signed)
Christina Wilkins - 62 y.o. female MRN [id number]  Date of birth: [birth year]  SUBJECTIVE:  Including CC & ROS.  Chief Complaint  Patient presents with  . Knee Pain    Christina Wilkins is a 62 y.o. female that is here today for right knee pain. Pain has been increasing over the past two weeks. Pain is mild to severe during flexion. Her knee was drained on 03/31/18-appeared to have a right lateral meniscal cyst. She has been taking mobic for the pain. She has a trip planned to [place] next week.     Review of Systems  Constitutional: Negative for fever.  HENT: Negative for congestion.   Respiratory: Negative for cough.   Cardiovascular: Negative for chest pain.  Gastrointestinal: Negative for abdominal pain.  Musculoskeletal: Negative for gait problem.  Skin: Negative for color change.  Neurological: Negative for weakness.  Hematological: Negative for adenopathy.  Psychiatric/Behavioral: Negative for agitation.    HISTORY: Past Medical, Surgical, Social, and Family History Reviewed & Updated per EMR.   Pertinent Historical Findings include:  Past Medical History:  Diagnosis Date  . Allergic rhinitis    seasonal summer cough takes claritin  . Varicella     Past Surgical History:  Procedure Laterality Date  . BUNIONECTOMY    . right eye scar tissue on cornea removed  April 2011    No Known Allergies  Family History  Problem Relation Age of Onset  . Breast cancer Mother   . Hypothyroidism Mother   . Alcohol abuse Father   . Hypertension Father      Social History   Socioeconomic History  . Marital status: Single    Spouse name: Not on file  . Number of children: Not on file  . Years of education: Not on file  . Highest education level: Not on file  Occupational History  . Not on file  Social Needs  . Financial resource strain: Not on file  . Food insecurity:    Worry: Not on file    Inability: Not on file  . Transportation needs:    Medical: Not on file   Non-medical: Not on file  Tobacco Use  . Smoking status: Never Smoker  . Smokeless tobacco: Never Used  Substance and Sexual Activity  . Alcohol use: Yes    Comment: per pt a beer once a week   . Drug use: Not on file  . Sexual activity: Not on file  Lifestyle  . Physical activity:    Days per week: Not on file    Minutes per session: Not on file  . Stress: Not on file  Relationships  . Social connections:    Talks on phone: Not on file    Gets together: Not on file    Attends religious service: Not on file    Active member of club or organization: Not on file    Attends meetings of clubs or organizations: Not on file    Relationship status: Not on file  . Intimate partner violence:    Fear of current or ex partner: Not on file    Emotionally abused: Not on file    Physically abused: Not on file    Forced sexual activity: Not on file  Other Topics Concern  . Not on file  Social History Narrative   Single pharmacist   Regular Exercise- yes   hh of 3   Two adopted children      PHYSICAL EXAM:  VS: BP 122/76 (  BP Location: Left Arm, Patient Position: Sitting, Cuff Size: Normal)   Pulse 66   Ht 5\' 7"  (1.702 m)   Wt 149 lb (67.6 kg)   SpO2 99%   BMI 23.34 kg/m  Physical Exam Gen: NAD, alert, cooperative with exam, well-appearing ENT: normal lips, normal nasal mucosa,  Eye: normal EOM, normal conjunctiva and lids CV:  no edema, +2 pedal pulses   Resp: no accessory muscle use, non-labored,   Skin: no rashes, no areas of induration  Neuro: normal tone, normal sensation to touch Psych:  normal insight, alert and oriented MSK:  Right knee:  TTP along the lateral proximal tibia.  No obvious effusion  Normal flexion and extension  No instability  Negative McMurray's test  Neurovascularly intact.    Aspiration/Injection Procedure Note Christina Wilkins 1956/10/06  Procedure: Aspiration and Injection Indications: Right knee cyst   Procedure Details Consent: Risks  of procedure as well as the alternatives and risks of each were explained to the (patient/caregiver).  Consent for procedure obtained. Time Out: Verified patient identification, verified procedure, site/side was marked, verified correct patient position, special equipment/implants available, medications/allergies/relevent history reviewed, required imaging and test results available.  Performed.  The area was cleaned with iodine and alcohol swabs.    The right lateral knee cyst was injected using 3 cc of 1% lidocaine without epinephrine.  Aspiration was completed with an 18-gauge needle inch and a half under ultrasound guidance.  A jelly liquid was aspirated.  The syringe was switched and a mixture containing 1 cc's of 40 mg Kenalog and 2 cc's of 0.5% bupivacaine was injected into the cyst.  Ultrasound was used.   Amount of Fluid Aspirated: minimal amount Character of Fluid: gelatinous Fluid was sent for:n/a A sterile dressing was applied.  Patient did tolerate procedure well.    ASSESSMENT & PLAN:   Cyst of knee joint, right Had a re-occurrence. No effusion in the knee. No significant degenerative changes  - cyst aspiration and injection today  - counseled on HEP  - counseled on compression  - will f/u after her vacation if still symptomatic.

## 2018-08-27 NOTE — Assessment & Plan Note (Signed)
Had a re-occurrence. No effusion in the knee. No significant degenerative changes  - cyst aspiration and injection today  - counseled on HEP  - counseled on compression  - will f/u after her vacation if still symptomatic.

## 2018-09-03 ENCOUNTER — Other Ambulatory Visit: Payer: Self-pay | Admitting: Internal Medicine

## 2018-09-03 DIAGNOSIS — Z1231 Encounter for screening mammogram for malignant neoplasm of breast: Secondary | ICD-10-CM

## 2018-10-02 ENCOUNTER — Ambulatory Visit (INDEPENDENT_AMBULATORY_CARE_PROVIDER_SITE_OTHER): Payer: 59

## 2018-10-02 DIAGNOSIS — Z23 Encounter for immunization: Secondary | ICD-10-CM

## 2018-10-05 ENCOUNTER — Ambulatory Visit
Admission: RE | Admit: 2018-10-05 | Discharge: 2018-10-05 | Disposition: A | Discharge status: Home / Self Care | Payer: 59 | Source: Ambulatory Visit | Attending: Internal Medicine | Admitting: Internal Medicine

## 2018-10-05 DIAGNOSIS — Z1231 Encounter for screening mammogram for malignant neoplasm of breast: Secondary | ICD-10-CM | POA: Diagnosis not present

## 2018-12-10 ENCOUNTER — Ambulatory Visit (INDEPENDENT_AMBULATORY_CARE_PROVIDER_SITE_OTHER)
Admission: RE | Admit: 2018-12-10 | Discharge: 2018-12-10 | Disposition: A | Discharge status: Home / Self Care | Payer: 59 | Source: Ambulatory Visit | Attending: Family Medicine | Admitting: Family Medicine

## 2018-12-10 ENCOUNTER — Ambulatory Visit: Payer: 59 | Admitting: Family Medicine

## 2018-12-10 ENCOUNTER — Ambulatory Visit: Payer: Self-pay

## 2018-12-10 ENCOUNTER — Encounter: Payer: Self-pay | Admitting: Family Medicine

## 2018-12-10 VITALS — BP 120/82 | HR 76 | Ht 67.0 in | Wt 149.0 lb

## 2018-12-10 DIAGNOSIS — M25861 Other specified joint disorders, right knee: Secondary | ICD-10-CM | POA: Diagnosis not present

## 2018-12-10 DIAGNOSIS — G8929 Other chronic pain: Secondary | ICD-10-CM

## 2018-12-10 DIAGNOSIS — M25561 Pain in right knee: Secondary | ICD-10-CM

## 2018-12-10 NOTE — Assessment & Plan Note (Signed)
Concerned with his complex cyst and with a recurrence within 2 months.  Patient does have been somewhat tracking towards the fibula which does bring into the differential of a potential neuroendocrine tumor.  Patient though is not having any foot drop, pain seems to come and go, denies any fevers chills or any abnormal weight loss.  Discussed with patient about possible repeat aspiration which patient declined.  We discussed though because it does not seem to go to the knee joint that possible surgical intervention could be curative.  Patient will consider this.  Discussed that I would like an x-ray and before we would send patient to any surgical intervention we would get an MRI to further evaluate the cyst.  Patient is in agreement with plan and will follow-up as needed

## 2018-12-10 NOTE — Progress Notes (Signed)
Tawana ScaleZach Darron Stuck D.O. [facility] [address] [place], [place] [address] Phone: [phone] Subjective:   Bruce Donath, Christina Wilkins, am serving as a scribe for Dr. Antoine PrimasZachary Alexandru Moorer.   CC: Right knee pain  OZH:[id number]:Subjective  Christina LemaMichelle Elizabeth Wilkins is a 62 y.o. female coming in with complaint of right knee pain. She feels a fullness in the right knee since October. Did run in a race over the weekend. No pain with running but wants a long term solution so that she does not get another cyst.  Patient states that she is concerned that the cyst will continue to reaccumulate.  Patient would like to know what it is and what is her other options.  Seems to come and go.  Not stopping her from any activity at this moment.    Past Medical History:  Diagnosis Date  . Allergic rhinitis    seasonal summer cough takes claritin  . Varicella    Past Surgical History:  Procedure Laterality Date  . BUNIONECTOMY    . right eye scar tissue on cornea removed  April 2011   Social History   Socioeconomic History  . Marital status: Single    Spouse name: Not on file  . Number of children: Not on file  . Years of education: Not on file  . Highest education level: Not on file  Occupational History  . Not on file  Social Needs  . Financial resource strain: Not on file  . Food insecurity:    Worry: Not on file    Inability: Not on file  . Transportation needs:    Medical: Not on file    Non-medical: Not on file  Tobacco Use  . Smoking status: Never Smoker  . Smokeless tobacco: Never Used  Substance and Sexual Activity  . Alcohol use: Yes    Comment: per pt a beer once a week   . Drug use: Not on file  . Sexual activity: Not on file  Lifestyle  . Physical activity:    Days per week: Not on file    Minutes per session: Not on file  . Stress: Not on file  Relationships  . Social connections:    Talks on phone: Not on file    Gets together: Not on file    Attends religious service: Not on file      Active member of club or organization: Not on file    Attends meetings of clubs or organizations: Not on file    Relationship status: Not on file  Other Topics Concern  . Not on file  Social History Narrative   Single pharmacist   Regular Exercise- yes   hh of 3   Two adopted children    No Known Allergies Family History  Problem Relation Age of Onset  . Breast cancer Mother [id number]  . Hypothyroidism Mother   . Alcohol abuse Father   . Hypertension Father       Current Outpatient Medications (Respiratory):  .  loratadine (CLARITIN) 10 MG tablet, Take 10 mg by mouth daily.  Current Outpatient Medications (Analgesics):  .  meloxicam (MOBIC) 15 MG tablet, Take 1 tablet (15 mg total) by mouth daily.   Current Outpatient Medications (Other):  .  glucosamine-chondroitin 500-400 MG tablet, Take 1 tablet by mouth 3 (three) times daily. .  Misc Natural Products (TART CHERRY ADVANCED PO), Take by mouth. .  Multiple Vitamin (MULTIVITAMIN) tablet, Take 1 tablet by mouth daily. Marland Kitchen.  zolpidem (AMBIEN) 5 MG  tablet, Take 1 tablet (5 mg total) by mouth at bedtime as needed. for sleep    Past medical history, social, surgical and family history all reviewed in electronic medical record.  No pertanent information unless stated regarding to the chief complaint.   Review of Systems:  No headache, visual changes, nausea, vomiting, diarrhea, constipation, dizziness, abdominal pain, skin rash, fevers, chills, night sweats, weight loss, swollen lymph nodes, body aches, joint swelling, muscle aches, chest pain, shortness of breath, mood changes.   Objective  Blood pressure 120/82, pulse 76, height 5\' 7"  (1.702 m), weight 149 lb (67.6 kg), SpO2 98 %.    General: No apparent distress alert and oriented x3 mood and affect normal, dressed appropriately.  HEENT: Pupils equal, extraocular movements intact  Respiratory: Patient's speak in full sentences and does not appear short of breath   Cardiovascular: No lower extremity edema, non tender, no erythema  Skin: Warm dry intact with no signs of infection or rash on extremities or on axial skeleton.  Abdomen: Soft nontender  Neuro: Cranial nerves II through XII are intact, neurovascularly intact in all extremities with 2+ DTRs and 2+ pulses.  Lymph: No lymphadenopathy of posterior or anterior cervical chain or axillae bilaterally.  Gait normal with good balance and coordination.  MSK:  Non tender with full range of motion and good stability and symmetric strength and tone of shoulders, elbows, wrist, hip and ankles bilaterally.   Right knee exam shows the patient does have some fullness on the lateral aspect of the knee.  Severely tender to palpation around the medial line with a fullness of the anterior lateral joint space.  Patient does have full range of motion.  Mild narrowing noted of the joint space of the medial aspect bilaterally right greater than left.  Limited musculoskeletal ultrasound was performed and interpreted by .Judi SaaZachary M Emree Locicero  Limited ultrasound shows that patient does have what appears to be a complex cystic structure that is superficial to the knee joint itself.  Mild increase in Doppler flow surrounding the area.  No signs of any type of infectious etiology.  Difficult to assess where the origination comes from.  Does track to the fibular area. Impression: Superficial cyst.   Impression and Recommendations:     This case required medical decision making of moderate complexity. The above documentation has been reviewed and is accurate and complete Judi SaaZachary M Meshia Rau, DO       Note: This dictation was prepared with Dragon dictation along with smaller phrase technology. Any transcriptional errors that result from this process are unintentional.

## 2018-12-10 NOTE — Patient Instructions (Signed)
[phone] when you need us

## 2018-12-11 ENCOUNTER — Encounter: Payer: Self-pay | Admitting: Family Medicine

## 2019-01-09 IMAGING — MG DIGITAL SCREENING BILATERAL MAMMOGRAM WITH CAD
4 series · 4 of 4 positions shown · non-contrast
Comparison: Previous exam(s).

CLINICAL DATA: Screening.

EXAM:
DIGITAL SCREENING BILATERAL MAMMOGRAM WITH CAD

[R CC]
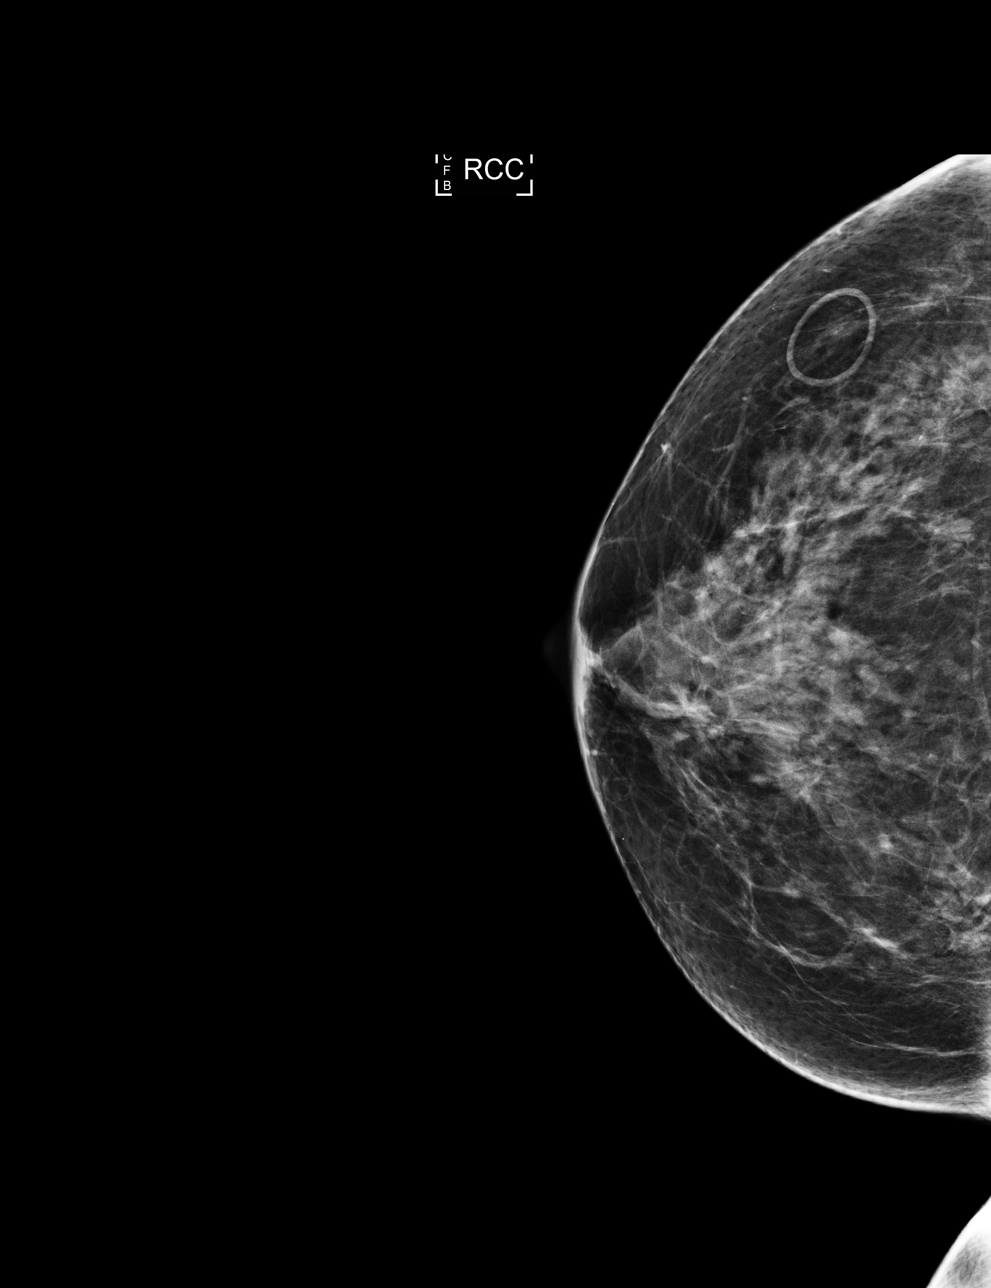

[L MLO]
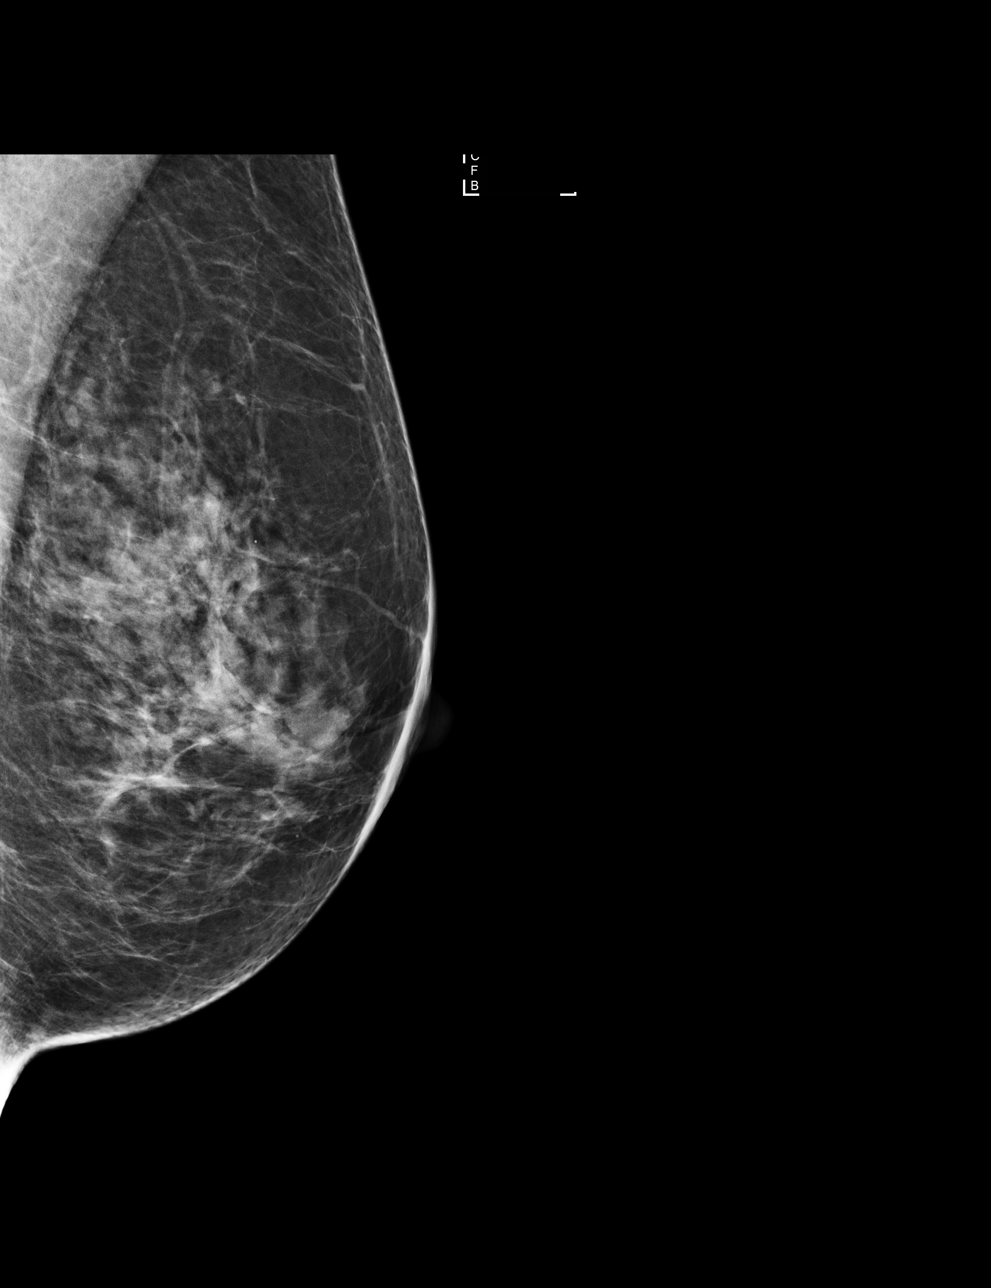

[L CC]
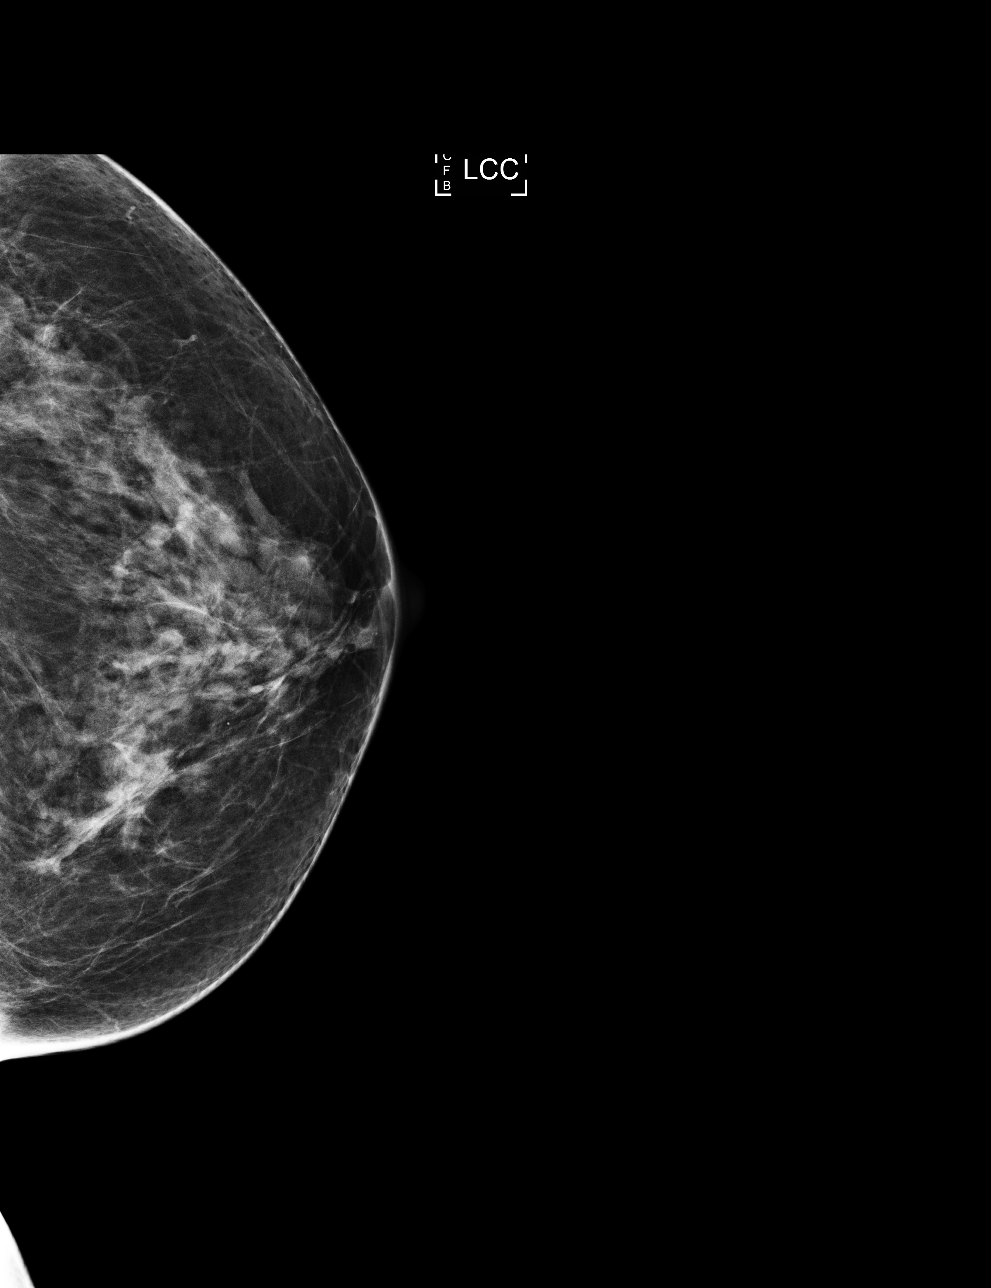

[R MLO]
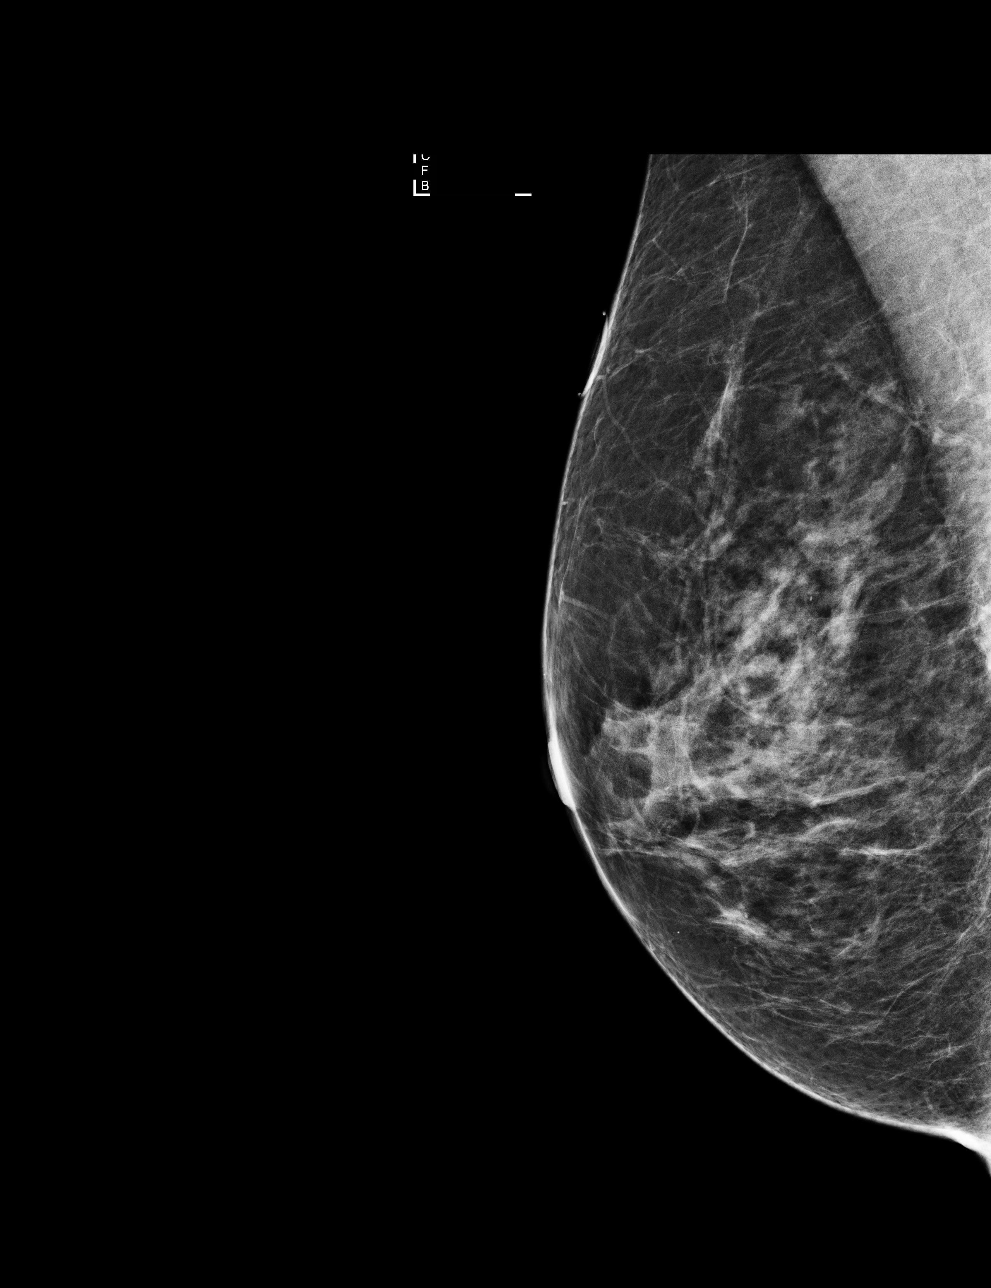

[4 of 4 positions shown; findings below may reference images not displayed]

ACR Breast Density Category c: The breast tissue is heterogeneously
dense, which may obscure small masses.
FINDINGS: There are no findings suspicious for malignancy. Images were
processed with CAD.
IMPRESSION: No mammographic evidence of malignancy. A result letter of this
screening mammogram will be mailed directly to the patient.

RECOMMENDATION:
Screening mammogram in one year. (Code:YJ-[id number])

BI-RADS CATEGORY  1: Negative.

## 2019-01-14 DIAGNOSIS — L570 Actinic keratosis: Secondary | ICD-10-CM | POA: Diagnosis not present

## 2019-01-21 NOTE — Progress Notes (Signed)
Chief Complaint  Patient presents with  . Annual Exam    Pt would like shingrix vaccine. Pt is healthy and well and has no concerns     HPI: Patient  Christina Wilkins  63 y.o. comes in today for Preventive Health Care visit  Needs refill ambien 5 only takes with travel per job   30 instaed of 15 . mobic helps shoulder given by dr Tamala Julian  Can we refill not taking daily.   Health Maintenance  Topic Date Due  . Hepatitis C Screening  10[birth year]  . HIV Screening  06/06/1973  . PAP SMEAR-Modifier  08/29/2013  . TETANUS/TDAP  12/23/2016  . COLONOSCOPY  12/30/2018  . MAMMOGRAM  10/05/2020  . INFLUENZA VACCINE  Completed   Health Maintenance Review LIFESTYLE:  Exercise:  active Tobacco/ETS:n Alcohol: n Sugar beverages:n Sleep:good uses med with  travel Drug use: no HH of 1 Work:nl 40 [place]  One child ecu another she doesn't hear from  But apparently sober and safe( in [place]) menopause early 14s     ROS:  GEN/ HEENT: No fever, significant weight changes sweats headaches vision problems hearing changes, CV/ PULM; No chest pain shortness of breath cough, syncope,edema  change in exercise tolerance. GI /GU: No adominal pain, vomiting, change in bowel habits. No blood in the stool. No significant GU symptoms. SKIN/HEME: ,no acute skin rashes suspicious lesions or bleeding. No lymphadenopathy, nodules, masses.  NEURO/ PSYCH:  No neurologic signs such as weakness numbness. No depression anxiety. IMM/ Allergy: No unusual infections.  Allergy .   REST of 12 system review negative except as per HPI   Past Medical History:  Diagnosis Date  . Allergic rhinitis    seasonal summer cough takes claritin  . Varicella     Past Surgical History:  Procedure Laterality Date  . BUNIONECTOMY    . right eye scar tissue on cornea removed  April 2011    Family History  Problem Relation Age of Onset  . Breast cancer Mother 69  . Hypothyroidism Mother   . Alcohol abuse  Father   . Hypertension Father     Social History   Socioeconomic History  . Marital status: Single    Spouse name: Not on file  . Number of children: Not on file  . Years of education: Not on file  . Highest education level: Not on file  Occupational History  . Not on file  Social Needs  . Financial resource strain: Not on file  . Food insecurity:    Worry: Not on file    Inability: Not on file  . Transportation needs:    Medical: Not on file    Non-medical: Not on file  Tobacco Use  . Smoking status: Never Smoker  . Smokeless tobacco: Never Used  Substance and Sexual Activity  . Alcohol use: Yes    Comment: per pt a beer once a week   . Drug use: Not on file  . Sexual activity: Not on file  Lifestyle  . Physical activity:    Days per week: Not on file    Minutes per session: Not on file  . Stress: Not on file  Relationships  . Social connections:    Talks on phone: Not on file    Gets together: Not on file    Attends religious service: Not on file    Active member of club or organization: Not on file    Attends meetings of clubs  or organizations: Not on file    Relationship status: Not on file  Other Topics Concern  . Not on file  Social History Narrative   Single pharmacist   Regular Exercise- yes   hh of 3   Two adopted children     Outpatient Medications Prior to Visit  Medication Sig Dispense Refill  . glucosamine-chondroitin 500-400 MG tablet Take 1 tablet by mouth 3 (three) times daily.    Marland Kitchen loratadine (CLARITIN) 10 MG tablet Take 10 mg by mouth daily.    . Misc Natural Products (TART CHERRY ADVANCED PO) Take by mouth.    . Multiple Vitamin (MULTIVITAMIN) tablet Take 1 tablet by mouth daily.    . meloxicam (MOBIC) 15 MG tablet Take 1 tablet (15 mg total) by mouth daily. 90 tablet 1  . zolpidem (AMBIEN) 5 MG tablet Take 1 tablet (5 mg total) by mouth at bedtime as needed. for sleep 15 tablet 2   No facility-administered medications prior to visit.        EXAM:  BP 118/72 (BP Location: Right Arm, Patient Position: Sitting, Cuff Size: Normal)   Pulse 66   Temp 98 F (36.7 C) (Oral)   Ht 5' 6.5" (1.689 m)   Wt 149 lb 6.4 oz (67.8 kg)   BMI 23.75 kg/m   Body mass index is 23.75 kg/m. Wt Readings from Last 3 Encounters:  01/22/19 149 lb 6.4 oz (67.8 kg)  12/10/18 149 lb (67.6 kg)  08/27/18 149 lb (67.6 kg)    Physical Exam: Vital signs reviewed UMP:NTIR is a well-developed well-nourished alert cooperative    who appearsr stated age in no acute distress.  HEENT: normocephalic atraumatic , Eyes: PERRL EOM's full, conjunctiva clear, Nares: paten,t no deformity discharge or tenderness., Ears: no deformity EAC's clear TMs with normal landmarks. Mouth: clear OP, no lesions, edema.  Moist mucous membranes. Dentition in adequate repair. NECK: supple without masses, thyromegaly or bruits. CHEST/PULM:  Clear to auscultation and percussion breath sounds equal no wheeze , rales or rhonchi. No chest wall deformities or tenderness. Breast: normal by inspection . No dimpling, discharge, masses, tenderness or discharge . CV: PMI is nondisplaced, S1 S2 no gallops, murmurs, rubs. Peripheral pulses are full without delay.No JVD .  ABDOMEN: Bowel sounds normal nontender  No guard or rebound, no hepato splenomegal no CVA tenderness.  No hernia. Extremtities:  No clubbing cyanosis or edema, no acute joint swelling or redness no focal atrophy NEURO:  Oriented x3, cranial nerves 3-12 appear to be intact, no obvious focal weakness,gait within normal limits no abnormal reflexes or asymmetrical SKIN: No acute rashes normal turgor, color, no bruising or petechiae. PSYCH: Oriented, good eye contact, no obvious depression anxiety, cognition and judgment appear normal. LN: no cervical axillary inguinal adenopathy Pelvic: NL ext GU, labia clear without lesions or rash . Vagina no lesions .Cervix: posterior  Limited  visualized but no masses   UTERUS: Neg CMT  Adnexa:  clear no masses . PAP done hi risk hpv   BP Readings from Last 3 Encounters:  01/22/19 118/72  12/10/18 120/82  08/27/18 122/76    ASSESSMENT AND PLAN:  Discussed the following assessment and plan:  Visit for preventive health examination - Plan: Basic metabolic panel, CBC with Differential/Platelet, Hepatic function panel, Lipid panel, TSH  Cervical cancer screening - Plan: PAP [[facility]]  Medication management - monitor risk benefot  - Plan: Basic metabolic panel, CBC with Differential/Platelet, Hepatic function panel, Lipid panel, TSH  Need for shingles  vaccine - Plan: Varicella-zoster vaccine IM (Shingrix)  H/O sleep disturbance - ambien for travel Will come back for tdap/td later  Patient Care Team: Panosh, Standley Brooking, MD as PCP - General Patient Instructions  Glad you are doing well.    Preventive Care 40-64 Years, Female Preventive care refers to lifestyle choices and visits with your health care provider that can promote health and wellness. What does preventive care include?   A yearly physical exam. This is also called an annual well check.  Dental exams once or twice a year.  Routine eye exams. Ask your health care provider how often you should have your eyes checked.  Personal lifestyle choices, including: ? Daily care of your teeth and gums. ? Regular physical activity. ? Eating a healthy diet. ? Avoiding tobacco and drug use. ? Limiting alcohol use. ? Practicing safe sex. ? Taking low-dose aspirin daily starting at age 32. ? Taking vitamin and mineral supplements as recommended by your health care provider. What happens during an annual well check? The services and screenings done by your health care provider during your annual well check will depend on your age, overall health, lifestyle risk factors, and family history of disease. Counseling Your health care provider may ask you questions about your:  Alcohol use.  Tobacco use.  Drug  use.  Emotional well-being.  Home and relationship well-being.  Sexual activity.  Eating habits.  Work and work Statistician.  Method of birth control.  Menstrual cycle.  Pregnancy history. Screening You may have the following tests or measurements:  Height, weight, and BMI.  Blood pressure.  Lipid and cholesterol levels. These may be checked every 5 years, or more frequently if you are over 5 years old.  Skin check.  Lung cancer screening. You may have this screening every year starting at age 55 if you have a 30-pack-year history of smoking and currently smoke or have quit within the past 15 years.  Colorectal cancer screening. All adults should have this screening starting at age 3 and continuing until age 6. Your health care provider may recommend screening at age 97. You will have tests every 1-10 years, depending on your results and the type of screening test. People at increased risk should start screening at an earlier age. Screening tests may include: ? Guaiac-based fecal occult blood testing. ? Fecal immunochemical test (FIT). ? Stool DNA test. ? Virtual colonoscopy. ? Sigmoidoscopy. During this test, a flexible tube with a tiny camera (sigmoidoscope) is used to examine your rectum and lower colon. The sigmoidoscope is inserted through your anus into your rectum and lower colon. ? Colonoscopy. During this test, a long, thin, flexible tube with a tiny camera (colonoscope) is used to examine your entire colon and rectum.  Hepatitis C blood test.  Hepatitis B blood test.  Sexually transmitted disease (STD) testing.  Diabetes screening. This is done by checking your blood sugar (glucose) after you have not eaten for a while (fasting). You may have this done every 1-3 years.  Mammogram. This may be done every 1-2 years. Talk to your health care provider about when you should start having regular mammograms. This may depend on whether you have a family history of  breast cancer.  BRCA-related cancer screening. This may be done if you have a family history of breast, ovarian, tubal, or peritoneal cancers.  Pelvic exam and Pap test. This may be done every 3 years starting at age 71. Starting at age 31, this may be done  every 5 years if you have a Pap test in combination with an HPV test.  Bone density scan. This is done to screen for osteoporosis. You may have this scan if you are at high risk for osteoporosis. Discuss your test results, treatment options, and if necessary, the need for more tests with your health care provider. Vaccines Your health care provider may recommend certain vaccines, such as:  Influenza vaccine. This is recommended every year.  Tetanus, diphtheria, and acellular pertussis (Tdap, Td) vaccine. You may need a Td booster every 10 years.  Varicella vaccine. You may need this if you have not been vaccinated.  Zoster vaccine. You may need this after age 44.  Measles, mumps, and rubella (MMR) vaccine. You may need at least one dose of MMR if you were born in 1957 or later. You may also need a second dose.  Pneumococcal 13-valent conjugate (PCV13) vaccine. You may need this if you have certain conditions and were not previously vaccinated.  Pneumococcal polysaccharide (PPSV23) vaccine. You may need one or two doses if you smoke cigarettes or if you have certain conditions.  Meningococcal vaccine. You may need this if you have certain conditions.  Hepatitis A vaccine. You may need this if you have certain conditions or if you travel or work in places where you may be exposed to hepatitis A.  Hepatitis B vaccine. You may need this if you have certain conditions or if you travel or work in places where you may be exposed to hepatitis B.  Haemophilus influenzae type b (Hib) vaccine. You may need this if you have certain conditions. Talk to your health care provider about which screenings and vaccines you need and how often you need  them. This information is not intended to replace advice given to you by your health care provider. Make sure you discuss any questions you have with your health care provider. Document Released: 01/05/2016 Document Revised: 01/29/2018 Document Reviewed: 10/10/2015 Elsevier Interactive Patient Education  2019 [place] K. Panosh M.D.

## 2019-01-22 ENCOUNTER — Other Ambulatory Visit (HOSPITAL_COMMUNITY)
Admission: RE | Admit: 2019-01-22 | Discharge: 2019-01-22 | Disposition: A | Discharge status: Home / Self Care | Payer: 59 | Source: Ambulatory Visit | Attending: Internal Medicine | Admitting: Internal Medicine

## 2019-01-22 ENCOUNTER — Ambulatory Visit (INDEPENDENT_AMBULATORY_CARE_PROVIDER_SITE_OTHER): Payer: 59 | Admitting: Internal Medicine

## 2019-01-22 ENCOUNTER — Encounter: Payer: Self-pay | Admitting: Internal Medicine

## 2019-01-22 VITALS — BP 118/72 | HR 66 | Temp 98.0°F | Ht 66.5 in | Wt 149.4 lb

## 2019-01-22 DIAGNOSIS — Z0000 Encounter for general adult medical examination without abnormal findings: Secondary | ICD-10-CM

## 2019-01-22 DIAGNOSIS — Z23 Encounter for immunization: Secondary | ICD-10-CM

## 2019-01-22 DIAGNOSIS — Z79899 Other long term (current) drug therapy: Secondary | ICD-10-CM

## 2019-01-22 DIAGNOSIS — Z124 Encounter for screening for malignant neoplasm of cervix: Secondary | ICD-10-CM | POA: Insufficient documentation

## 2019-01-22 DIAGNOSIS — Z8669 Personal history of other diseases of the nervous system and sense organs: Secondary | ICD-10-CM

## 2019-01-22 DIAGNOSIS — Z87898 Personal history of other specified conditions: Secondary | ICD-10-CM

## 2019-01-22 LAB — LIPID PANEL
CHOLESTEROL: 215 mg/dL — AB (ref 0–200)
HDL: 86.1 mg/dL (ref >39.00)
LDL Cholesterol: 111 mg/dL — ABNORMAL HIGH (ref 0–99)
NONHDL: 129.16
Total CHOL/HDL Ratio: 3
Triglycerides: 90 mg/dL (ref 0.0–149.0)
VLDL: 18 mg/dL (ref 0.0–40.0)

## 2019-01-22 LAB — CBC WITH DIFFERENTIAL/PLATELET
BASOS ABS: 0 10*3/uL (ref 0.0–0.1)
Basophils Relative: 0.5 % (ref 0.0–3.0)
EOS PCT: 1.3 % (ref 0.0–5.0)
Eosinophils Absolute: 0.1 10*3/uL (ref 0.0–0.7)
HEMATOCRIT: 44 % (ref 36.0–46.0)
HEMOGLOBIN: 14.6 g/dL (ref 12.0–15.0)
LYMPHS PCT: 31.6 % (ref 12.0–46.0)
Lymphs Abs: 1.6 10*3/uL (ref 0.7–4.0)
MCHC: 33.2 g/dL (ref 30.0–36.0)
MCV: 89.2 fl (ref 78.0–100.0)
MONOS PCT: 5.7 % (ref 3.0–12.0)
Monocytes Absolute: 0.3 10*3/uL (ref 0.1–1.0)
NEUTROS PCT: 60.9 % (ref 43.0–77.0)
Neutro Abs: 3 10*3/uL (ref 1.4–7.7)
Platelets: 199 10*3/uL (ref 150.0–400.0)
RBC: 4.93 Mil/uL (ref 3.87–5.11)
RDW: 13 % (ref 11.5–15.5)
WBC: 5 10*3/uL (ref 4.0–10.5)

## 2019-01-22 LAB — HEPATIC FUNCTION PANEL
ALBUMIN: 4.3 g/dL (ref 3.5–5.2)
ALK PHOS: 62 U/L (ref 39–117)
ALT: 14 U/L (ref 0–35)
AST: 15 U/L (ref 0–37)
Bilirubin, Direct: 0.2 mg/dL (ref 0.0–0.3)
Total Bilirubin: 1.4 mg/dL — ABNORMAL HIGH (ref 0.2–1.2)
Total Protein: 6.5 g/dL (ref 6.0–8.3)

## 2019-01-22 LAB — TSH: TSH: 2.24 u[IU]/mL (ref 0.35–4.50)

## 2019-01-22 LAB — BASIC METABOLIC PANEL
BUN: 17 mg/dL (ref 6–23)
CALCIUM: 10 mg/dL (ref 8.4–10.5)
CO2: 29 mEq/L (ref 19–32)
Chloride: 103 mEq/L (ref 96–112)
Creatinine, Ser: 0.88 mg/dL (ref 0.40–1.20)
GFR: 65.41 mL/min (ref >60.00)
GLUCOSE: 91 mg/dL (ref 70–99)
POTASSIUM: 4.6 meq/L (ref 3.5–5.1)
SODIUM: 139 meq/L (ref 135–145)

## 2019-01-22 MED ORDER — ZOLPIDEM TARTRATE 5 MG PO TABS: 5.0000 mg | ORAL_TABLET | Freq: Every evening | ORAL | 1 refills | Status: DC | PRN

## 2019-01-22 MED ORDER — MELOXICAM 15 MG PO TABS: 15.0000 mg | ORAL_TABLET | Freq: Every day | ORAL | 1 refills | Status: DC

## 2019-01-22 NOTE — Patient Instructions (Signed)
Glad you are doing well.    Preventive Care 40-64 Years, Female Preventive care refers to lifestyle choices and visits with your health care provider that can promote health and wellness. What does preventive care include?   A yearly physical exam. This is also called an annual well check.  Dental exams once or twice a year.  Routine eye exams. Ask your health care provider how often you should have your eyes checked.  Personal lifestyle choices, including: ? Daily care of your teeth and gums. ? Regular physical activity. ? Eating a healthy diet. ? Avoiding tobacco and drug use. ? Limiting alcohol use. ? Practicing safe sex. ? Taking low-dose aspirin daily starting at age 38. ? Taking vitamin and mineral supplements as recommended by your health care provider. What happens during an annual well check? The services and screenings done by your health care provider during your annual well check will depend on your age, overall health, lifestyle risk factors, and family history of disease. Counseling Your health care provider may ask you questions about your:  Alcohol use.  Tobacco use.  Drug use.  Emotional well-being.  Home and relationship well-being.  Sexual activity.  Eating habits.  Work and work Statistician.  Method of birth control.  Menstrual cycle.  Pregnancy history. Screening You may have the following tests or measurements:  Height, weight, and BMI.  Blood pressure.  Lipid and cholesterol levels. These may be checked every 5 years, or more frequently if you are over 58 years old.  Skin check.  Lung cancer screening. You may have this screening every year starting at age 18 if you have a 30-pack-year history of smoking and currently smoke or have quit within the past 15 years.  Colorectal cancer screening. All adults should have this screening starting at age 13 and continuing until age 56. Your health care provider may recommend screening at age  50. You will have tests every 1-10 years, depending on your results and the type of screening test. People at increased risk should start screening at an earlier age. Screening tests may include: ? Guaiac-based fecal occult blood testing. ? Fecal immunochemical test (FIT). ? Stool DNA test. ? Virtual colonoscopy. ? Sigmoidoscopy. During this test, a flexible tube with a tiny camera (sigmoidoscope) is used to examine your rectum and lower colon. The sigmoidoscope is inserted through your anus into your rectum and lower colon. ? Colonoscopy. During this test, a long, thin, flexible tube with a tiny camera (colonoscope) is used to examine your entire colon and rectum.  Hepatitis C blood test.  Hepatitis B blood test.  Sexually transmitted disease (STD) testing.  Diabetes screening. This is done by checking your blood sugar (glucose) after you have not eaten for a while (fasting). You may have this done every 1-3 years.  Mammogram. This may be done every 1-2 years. Talk to your health care provider about when you should start having regular mammograms. This may depend on whether you have a family history of breast cancer.  BRCA-related cancer screening. This may be done if you have a family history of breast, ovarian, tubal, or peritoneal cancers.  Pelvic exam and Pap test. This may be done every 3 years starting at age 63. Starting at age 46, this may be done every 5 years if you have a Pap test in combination with an HPV test.  Bone density scan. This is done to screen for osteoporosis. You may have this scan if you are at high risk  for osteoporosis. Discuss your test results, treatment options, and if necessary, the need for more tests with your health care provider. Vaccines Your health care provider may recommend certain vaccines, such as:  Influenza vaccine. This is recommended every year.  Tetanus, diphtheria, and acellular pertussis (Tdap, Td) vaccine. You may need a Td booster every  10 years.  Varicella vaccine. You may need this if you have not been vaccinated.  Zoster vaccine. You may need this after age 8.  Measles, mumps, and rubella (MMR) vaccine. You may need at least one dose of MMR if you were born in 1957 or later. You may also need a second dose.  Pneumococcal 13-valent conjugate (PCV13) vaccine. You may need this if you have certain conditions and were not previously vaccinated.  Pneumococcal polysaccharide (PPSV23) vaccine. You may need one or two doses if you smoke cigarettes or if you have certain conditions.  Meningococcal vaccine. You may need this if you have certain conditions.  Hepatitis A vaccine. You may need this if you have certain conditions or if you travel or work in places where you may be exposed to hepatitis A.  Hepatitis B vaccine. You may need this if you have certain conditions or if you travel or work in places where you may be exposed to hepatitis B.  Haemophilus influenzae type b (Hib) vaccine. You may need this if you have certain conditions. Talk to your health care provider about which screenings and vaccines you need and how often you need them. This information is not intended to replace advice given to you by your health care provider. Make sure you discuss any questions you have with your health care provider. Document Released: 01/05/2016 Document Revised: 01/29/2018 Document Reviewed: 10/10/2015 Elsevier Interactive Patient Education  2019 [employer].

## 2019-01-26 LAB — CYTOLOGY - PAP
DIAGNOSIS: NEGATIVE
HPV: NOT DETECTED

## 2019-01-26 NOTE — Progress Notes (Signed)
t PAP is normal. HPV high  risk is negative

## 2019-02-19 ENCOUNTER — Ambulatory Visit (INDEPENDENT_AMBULATORY_CARE_PROVIDER_SITE_OTHER): Payer: 59 | Admitting: *Deleted

## 2019-02-19 DIAGNOSIS — Z23 Encounter for immunization: Secondary | ICD-10-CM | POA: Diagnosis not present

## 2019-02-19 NOTE — Progress Notes (Signed)
Patient in today for TDap vaccine. Vacicne administered and tolerated well

## 2019-02-20 ENCOUNTER — Encounter: Payer: Self-pay | Admitting: Gastroenterology

## 2019-03-15 NOTE — Telephone Encounter (Signed)
If you dont have  A First degree relative  Or strong family history of colon cancer  . cologuard is appropriate test. IF there is a family history   You  should have a colonoscopy .    Advise  You check/ confirm  with your insurance plan that Test  is covered .. ( It is an expensive test. ) and then let us know  And we can order the cologuard test  You then will get  A mailing of the kit  to your home address .

## 2019-05-07 ENCOUNTER — Other Ambulatory Visit: Payer: Self-pay

## 2019-05-07 ENCOUNTER — Ambulatory Visit (INDEPENDENT_AMBULATORY_CARE_PROVIDER_SITE_OTHER): Payer: 59 | Admitting: *Deleted

## 2019-05-07 DIAGNOSIS — Z23 Encounter for immunization: Secondary | ICD-10-CM | POA: Diagnosis not present

## 2019-05-07 NOTE — Telephone Encounter (Signed)
Please get her the cpt code  thanks

## 2019-09-16 ENCOUNTER — Ambulatory Visit (INDEPENDENT_AMBULATORY_CARE_PROVIDER_SITE_OTHER): Payer: 59

## 2019-09-16 ENCOUNTER — Other Ambulatory Visit: Payer: Self-pay

## 2019-09-16 ENCOUNTER — Encounter

## 2019-09-16 DIAGNOSIS — Z23 Encounter for immunization: Secondary | ICD-10-CM

## 2019-11-08 ENCOUNTER — Other Ambulatory Visit: Payer: Self-pay | Admitting: Internal Medicine

## 2019-11-08 DIAGNOSIS — Z1231 Encounter for screening mammogram for malignant neoplasm of breast: Secondary | ICD-10-CM

## 2019-11-16 ENCOUNTER — Ambulatory Visit
Admission: RE | Admit: 2019-11-16 | Discharge: 2019-11-16 | Disposition: A | Discharge status: Home / Self Care | Payer: 59 | Source: Ambulatory Visit | Attending: Internal Medicine | Admitting: Internal Medicine

## 2019-11-16 ENCOUNTER — Other Ambulatory Visit: Payer: Self-pay

## 2019-11-16 DIAGNOSIS — Z1231 Encounter for screening mammogram for malignant neoplasm of breast: Secondary | ICD-10-CM

## 2020-03-27 NOTE — Telephone Encounter (Signed)
We dont have a rheumatologist in [place]   We can do a referral   To dr Durenda Age or other  But would need    More information and eval for that referral .  I suggest an in person visit here to  evaluate  And refer as indicated

## 2020-04-06 NOTE — Progress Notes (Signed)
This visit occurred during the SARS-CoV-2 public health emergency.  Safety protocols were in place, including screening questions prior to the visit, additional usage of staff PPE, and extensive cleaning of exam room while observing appropriate contact time as indicated for disinfecting solutions.    Chief Complaint  Patient presents with  . Referral    Knees, thumbs and wrists are really bothering her    HPI: Christina Wilkins 64 y.o. come in for referral to rheum request Last visit was   PV jan 2020 Over psat year   Knees and hand left thumb and hand finger pinky  swoll and nodules   Hard to use  Righ  No fever mtp and thumbs not awakening  And voltaren helps some.  Requests rheum consult   Goes to gym and  Knees worse yesterday  Left thumbsworse than right  Is  a pharmacist right handed   Mom has arthritis  But not know which kind  ROS: See pertinent positives and negatives per HPI.wellotherwise no rashs fever  cv other concerns   Past Medical History:  Diagnosis Date  . Allergic rhinitis    seasonal summer cough takes claritin  . Varicella     Family History  Problem Relation Age of Onset  . Breast cancer Mother 9  . Hypothyroidism Mother   . Alcohol abuse Father   . Hypertension Father     Social History   Socioeconomic History  . Marital status: Single    Spouse name: Not on file  . Number of children: Not on file  . Years of education: Not on file  . Highest education level: Not on file  Occupational History  . Not on file  Tobacco Use  . Smoking status: Never Smoker  . Smokeless tobacco: Never Used  Substance and Sexual Activity  . Alcohol use: Yes    Comment: per pt a beer once a week   . Drug use: Not on file  . Sexual activity: Not on file  Other Topics Concern  . Not on file  Social History Narrative   Single pharmacist   Regular Exercise- yes   hh of 3   Two adopted children    Social Determinants of Health   Financial Resource  Strain:   . Difficulty of Paying Living Expenses:   Food Insecurity:   . Worried About Programme researcher, broadcasting/film/video in the Last Year:   . Barista in the Last Year:   Transportation Needs:   . Freight forwarder (Medical):   Marland Kitchen Lack of Transportation (Non-Medical):   Physical Activity:   . Days of Exercise per Week:   . Minutes of Exercise per Session:   Stress:   . Feeling of Stress :   Social Connections:   . Frequency of Communication with Friends and Family:   . Frequency of Social Gatherings with Friends and Family:   . Attends Religious Services:   . Active Member of Clubs or Organizations:   . Attends Banker Meetings:   Marland Kitchen Marital Status:     Outpatient Medications Prior to Visit  Medication Sig Dispense Refill  . glucosamine-chondroitin 500-400 MG tablet Take 1 tablet by mouth 3 (three) times daily.    Marland Kitchen loratadine (CLARITIN) 10 MG tablet Take 10 mg by mouth daily.    . Misc Natural Products (TART CHERRY ADVANCED PO) Take by mouth.    . Multiple Vitamin (MULTIVITAMIN) tablet Take 1 tablet by mouth daily.    Marland Kitchen  naproxen sodium (ALEVE) 220 MG tablet Take 220 mg by mouth daily.    . TURMERIC PO Take 1 capsule by mouth daily.    Marland Kitchen zolpidem (AMBIEN) 5 MG tablet Take 1 tablet (5 mg total) by mouth at bedtime as needed. for sleep 30 tablet 1  . meloxicam (MOBIC) 15 MG tablet Take 1 tablet (15 mg total) by mouth daily. (Patient not taking: Reported on 04/07/2020) 90 tablet 1   No facility-administered medications prior to visit.     EXAM:  BP 122/72   Pulse 78   Temp 97.9 F (36.6 C) (Temporal)   Ht 5\' 7"  (1.702 m)   Wt 153 lb (69.4 kg)   SpO2 98%   BMI 23.96 kg/m   Body mass index is 23.96 kg/m.  GENERAL: vitals reviewed and listed above, alert, oriented, appears well hydrated and in no acute distress HEENT: atraumatic, conjunctiva  clear, no obvious abnormalities on inspection of external nose and ears OP : masked  NECK: no obvious masses on  inspection palpation  LUNGS: clear to auscultation bilaterally, no wheezes, rales or rhonchi, good air movement CV: HRRR, no clubbing cyanosis or  peripheral edema nl cap refill  MS: moves all extremities prominet right thumb joint an and nodule distao dip left pinkky  No effusin redness  Wrist seem nl  Right knee with medial prominence and lateral 3 cm cystic  Area  PSYCH: pleasant and cooperative, no obvious depression or anxiety Lab Results  Component Value Date   WBC 5.0 01/22/2019   HGB 14.6 01/22/2019   HCT 44.0 01/22/2019   PLT 199.0 01/22/2019   GLUCOSE 91 01/22/2019   CHOL 215 (H) 01/22/2019   TRIG 90.0 01/22/2019   HDL 86.10 01/22/2019   LDLCALC 111 (H) 01/22/2019   ALT 14 01/22/2019   AST 15 01/22/2019   NA 139 01/22/2019   K 4.6 01/22/2019   CL 103 01/22/2019   CREATININE 0.88 01/22/2019   BUN 17 01/22/2019   CO2 29 01/22/2019   TSH 2.24 01/22/2019   HGBA1C 5.3 06/16/2015   BP Readings from Last 3 Encounters:  04/07/20 122/72  01/22/19 118/72  12/10/18 120/82    ASSESSMENT AND PLAN:  Discussed the following assessment and plan:  Multiple joint pain - Plan: Basic metabolic panel, CBC with Differential/Platelet, Hepatic function panel, TSH, T4, free, Sedimentation rate, C-reactive protein, ANA, Cyclic citrul peptide antibody, IgG, Hepatitis C antibody, Iron, TIBC and Ferritin Panel  Arthritis - Plan: Basic metabolic panel, CBC with Differential/Platelet, Hepatic function panel, TSH, T4, free, Sedimentation rate, C-reactive protein, ANA, Cyclic citrul peptide antibody, IgG, Hepatitis C antibody, Iron, TIBC and Ferritin Panel  Cyst of knee joint, right Knee cyst and hands thumbs with heberden's nodes  Seem to be  OA type   But every day hands and knees problematic to  Interfere with nl activities    Pt alos concern about RA .    Labs today and referral to rheum  -Patient advised to return or notify health care team  if  new concerns arise.  Patient Instructions    Exam looks like osteoarthritis  . Will do referral  Lab today  To help define better .     Standley Brooking. Jaquis Picklesimer M.D.

## 2020-04-07 ENCOUNTER — Encounter: Payer: Self-pay | Admitting: Internal Medicine

## 2020-04-07 ENCOUNTER — Ambulatory Visit: Payer: 59 | Admitting: Internal Medicine

## 2020-04-07 ENCOUNTER — Other Ambulatory Visit: Payer: Self-pay

## 2020-04-07 VITALS — BP 122/72 | HR 78 | Temp 97.9°F | Ht 67.0 in | Wt 153.0 lb

## 2020-04-07 DIAGNOSIS — M2550 Pain in unspecified joint: Secondary | ICD-10-CM | POA: Diagnosis not present

## 2020-04-07 DIAGNOSIS — M25861 Other specified joint disorders, right knee: Secondary | ICD-10-CM

## 2020-04-07 DIAGNOSIS — M1990 Unspecified osteoarthritis, unspecified site: Secondary | ICD-10-CM

## 2020-04-07 LAB — CBC WITH DIFFERENTIAL/PLATELET
Basophils Absolute: 0 10*3/uL (ref 0.0–0.1)
Basophils Relative: 0.5 % (ref 0.0–3.0)
Eosinophils Absolute: 0.1 10*3/uL (ref 0.0–0.7)
Eosinophils Relative: 1.8 % (ref 0.0–5.0)
HCT: 41.9 % (ref 36.0–46.0)
Hemoglobin: 14.3 g/dL (ref 12.0–15.0)
Lymphocytes Relative: 30.4 % (ref 12.0–46.0)
Lymphs Abs: 1.3 10*3/uL (ref 0.7–4.0)
MCHC: 34.1 g/dL (ref 30.0–36.0)
MCV: 88.1 fl (ref 78.0–100.0)
Monocytes Absolute: 0.2 10*3/uL (ref 0.1–1.0)
Monocytes Relative: 5.1 % (ref 3.0–12.0)
Neutro Abs: 2.6 10*3/uL (ref 1.4–7.7)
Neutrophils Relative %: 62.2 % (ref 43.0–77.0)
Platelets: 212 10*3/uL (ref 150.0–400.0)
RBC: 4.76 Mil/uL (ref 3.87–5.11)
RDW: 14.1 % (ref 11.5–15.5)
WBC: 4.1 10*3/uL (ref 4.0–10.5)

## 2020-04-07 LAB — TSH: TSH: 2.42 u[IU]/mL (ref 0.35–4.50)

## 2020-04-07 LAB — BASIC METABOLIC PANEL
BUN: 16 mg/dL (ref 6–23)
CO2: 28 mEq/L (ref 19–32)
Calcium: 9.6 mg/dL (ref 8.4–10.5)
Chloride: 104 mEq/L (ref 96–112)
Creatinine, Ser: 0.88 mg/dL (ref 0.40–1.20)
GFR: 65.15 mL/min (ref >60.00)
Glucose, Bld: 63 mg/dL — ABNORMAL LOW (ref 70–99)
Potassium: 4.3 mEq/L (ref 3.5–5.1)
Sodium: 141 mEq/L (ref 135–145)

## 2020-04-07 LAB — HEPATIC FUNCTION PANEL
ALT: 14 U/L (ref 0–35)
AST: 15 U/L (ref 0–37)
Albumin: 4.3 g/dL (ref 3.5–5.2)
Alkaline Phosphatase: 78 U/L (ref 39–117)
Bilirubin, Direct: 0.2 mg/dL (ref 0.0–0.3)
Total Bilirubin: 1.1 mg/dL (ref 0.2–1.2)
Total Protein: 6.5 g/dL (ref 6.0–8.3)

## 2020-04-07 LAB — C-REACTIVE PROTEIN: CRP: <1 mg/dL (ref 0.5–20.0)

## 2020-04-07 LAB — T4, FREE: Free T4: 0.8 ng/dL (ref 0.60–1.60)

## 2020-04-07 LAB — SEDIMENTATION RATE: Sed Rate: 12 mm/hr (ref 0–30)

## 2020-04-07 NOTE — Patient Instructions (Signed)
Exam looks like osteoarthritis  . Will do referral  Lab today  To help define better .

## 2020-04-10 LAB — ANA: Anti Nuclear Antibody (ANA): NEGATIVE

## 2020-04-10 LAB — CYCLIC CITRUL PEPTIDE ANTIBODY, IGG: Cyclic Citrullin Peptide Ab: <16 UNITS

## 2020-04-10 LAB — IRON,TIBC AND FERRITIN PANEL
%SAT: 39 % (calc) (ref 16–45)
Ferritin: 27 ng/mL (ref 16–288)
Iron: 119 ug/dL (ref 45–160)
TIBC: 308 mcg/dL (calc) (ref 250–450)

## 2020-04-10 LAB — HEPATITIS C ANTIBODY
Hepatitis C Ab: NONREACTIVE
SIGNAL TO CUT-OFF: 0.04 (ref <1.00)

## 2020-04-10 NOTE — Progress Notes (Signed)
Blood results are normal    inflammatory markers  included and thyroid . blood sugar  borderline low but that could be from lab effect if not spun down immediately

## 2020-06-29 NOTE — Progress Notes (Signed)
Office Visit Note  Patient: Christina Wilkins             Date of Birth: September 30, 1956           MRN: [id number]             PCP: Burnis Medin, MD Referring: Burnis Medin, MD Visit Date: 07/12/2020 Occupation: @GUAROCC @  Subjective:  Pain in multiple joints.   History of Present Illness: Christina Wilkins is a 64 y.o. female seen in consultation per request of her PCP.  According to patient she has had pain in her right knee joint for many years.  She states she dislocated her knee when she was 64 years old.  She used to be an avid runner.  She has discomfort in both knees but right knee joint has been more painful.  She states in December 2019 she developed a cyst on her right knee for which she saw Dr. Hulan Saas who did ultrasound and drain her cyst.  She had recurrence of her wrist and which was drained again.  She also had x-rays at that time which showed mild chondromalacia patella.  She states she was experiencing numbness in her bilateral hands which was quite severe at the time she was seen by hand surgeon but she did not have a very good experience.  She states she was given braces to wear at nighttime which did not help.  Symptoms have improved but she still have some paresthesias.  She also describes pain at the base of her thumb worse on the left than the right side.  She has noticed changes in her hands.  She has been using Voltaren gel over her hands and her knee joints.  She has decreased grip strength.  She has discomfort in her feet.  She has intermittent swelling in her right knee joint but none of the other joints are swollen.  There is no family history of autoimmune disease.  Activities of Daily Living:  Patient reports morning stiffness for 0 minutes.   Patient Reports nocturnal pain.  Difficulty dressing/grooming: Denies Difficulty climbing stairs: Reports Difficulty getting out of chair: Reports Difficulty using hands for taps, buttons, cutlery,  and/or writing: Denies  Review of Systems  Constitutional: Positive for fatigue.  HENT: Negative for mouth sores, mouth dryness and nose dryness.   Eyes: Negative for itching and dryness.  Respiratory: Negative for shortness of breath and difficulty breathing.   Cardiovascular: Negative for chest pain and palpitations.  Gastrointestinal: Negative for blood in stool, constipation and diarrhea.  Endocrine: Negative for increased urination.  Genitourinary: Negative for difficulty urinating.  Musculoskeletal: Positive for arthralgias, joint pain and joint swelling. Negative for myalgias, morning stiffness, muscle tenderness and myalgias.  Skin: Negative for color change, rash and redness.  Allergic/Immunologic: Negative for susceptible to infections.  Neurological: Negative for dizziness, numbness, headaches, memory loss and weakness.  Hematological: Negative for bruising/bleeding tendency.  Psychiatric/Behavioral: Negative for confusion.    PMFS History:  Patient Active Problem List   Diagnosis Date Noted   Subluxation of tendon of long head of biceps 02/25/2018   Cyst of knee joint, right 02/25/2018   Paresthesia of both hands 06/16/2015   Menopausal and perimenopausal disorder 04/06/2013   Medication management 04/06/2013   H/O sleep disturbance 04/12/2012   THYROIDITIS 09/14/2010   MENOPAUSAL SYNDROME 09/14/2010   THYROID STIMULATING HORMONE, ABNORMAL 08/29/2010   ALLERGIC RHINITIS 08/29/2010    Past Medical History:  Diagnosis Date   Allergic rhinitis  seasonal summer cough takes claritin   Varicella     Family History  Problem Relation Age of Onset   Breast cancer Mother 81   Hypothyroidism Mother    Arthritis Mother    Alcohol abuse Father    Hypertension Father    Allergies Sister    Alcohol abuse Brother    Past Surgical History:  Procedure Laterality Date   BUNIONECTOMY     right eye scar tissue on cornea removed  April 2011   Social  History   Social History Narrative   Single pharmacist   Regular Exercise- yes   hh of 3   Two adopted children    Immunization History  Administered Date(s) Administered   Influenza Split 10/18/2011, 09/30/2012   Influenza Whole 10/12/2007, 09/27/2008, 10/16/2009, 08/29/2010   Influenza,inj,Quad PF,6+ Mos 09/20/2013, 09/02/2014, 08/21/2015, 10/07/2016, 09/08/2017, 10/02/2018, 09/16/2019   PFIZER SARS-COV-2 Vaccination 02/03/2020, 02/24/2020   PPD Test 03/24/2013   Td 12/23/2006   Tdap 02/19/2019   Zoster Recombinat (Shingrix) 01/22/2019, 05/07/2019     Objective: Vital Signs: BP 121/69 (BP Location: Right Arm, Patient Position: Sitting, Cuff Size: Normal)    Pulse 71    Resp 15    Ht 5' 6.75" (1.695 m)    Wt 150 lb 3.2 oz (68.1 kg)    BMI 23.70 kg/m    Physical Exam Vitals and nursing note reviewed.  Constitutional:      Appearance: She is well-developed.  HENT:     Head: Normocephalic and atraumatic.  Eyes:     Conjunctiva/sclera: Conjunctivae normal.  Cardiovascular:     Rate and Rhythm: Normal rate and regular rhythm.     Heart sounds: Normal heart sounds.  Pulmonary:     Effort: Pulmonary effort is normal.     Breath sounds: Normal breath sounds.  Abdominal:     General: Bowel sounds are normal.     Palpations: Abdomen is soft.  Musculoskeletal:     Cervical back: Normal range of motion.  Lymphadenopathy:     Cervical: No cervical adenopathy.  Skin:    General: Skin is warm and dry.     Capillary Refill: Capillary refill takes less than 2 seconds.  Neurological:     Mental Status: She is alert and oriented to person, place, and time.  Psychiatric:        Behavior: Behavior normal.      Musculoskeletal Exam: C-spine thoracic and lumbar spine with good range of motion.  She had no SI joint tenderness.  Shoulder joints, elbow joints, wrist joints with good range of motion.  She has bilateral CMC thickening.  She has PIP and DIP thickening.  No  synovitis was noted.  She has good range of motion of her hip joints and knee joints.  Mild warmth is noted in her right knee joint.  Ankle joints with good range of motion.  She has bilateral pes cavus, dorsal spur and PIP and DIP thickening.  She has calluses underneath the ball of her feet.  CDAI Exam: CDAI Score: -- Patient Global: --; Provider Global: -- Swollen: --; Tender: -- Joint Exam 07/12/2020   No joint exam has been documented for this visit   There is currently no information documented on the homunculus. Go to the Rheumatology activity and complete the homunculus joint exam.  Investigation: No additional findings.  Imaging: XR Hand 2 View Left  Result Date: 07/12/2020 CMC, PIP and DIP narrowing was noted.  No MCP, intercarpal or radiocarpal joint space narrowing  was noted.  No erosive changes were noted. Impression: These findings are consistent with osteoarthritis of the hand.  XR Hand 2 View Right  Result Date: 07/12/2020 CMC, PIP and DIP narrowing was noted.  No MCP, intercarpal or radiocarpal joint space narrowing was noted.  No erosive changes were noted. Impression: These findings are consistent with osteoarthritis of the hand.  XR KNEE 3 VIEW RIGHT  Result Date: 07/12/2020 No significant medial or lateral compartment narrowing was noted.  Intercondylar osteophytes were noted.  Moderate patellofemoral narrowing was noted.  There is radiographic progression of chondromalacia patella when compared to the x-rays of December 10, 2018. Impression: These findings are consistent with moderate chondromalacia patella.   Recent Labs: Lab Results  Component Value Date   WBC 4.1 04/07/2020   HGB 14.3 04/07/2020   PLT 212.0 04/07/2020   NA 141 04/07/2020   K 4.3 04/07/2020   CL 104 04/07/2020   CO2 28 04/07/2020   GLUCOSE 63 (L) 04/07/2020   BUN 16 04/07/2020   CREATININE 0.88 04/07/2020   BILITOT 1.1 04/07/2020   ALKPHOS 78 04/07/2020   AST 15 04/07/2020   ALT 14  04/07/2020   PROT 6.5 04/07/2020   ALBUMIN 4.3 04/07/2020   CALCIUM 9.6 04/07/2020    Speciality Comments: No specialty comments available.  Procedures:  No procedures performed Allergies: Patient has no known allergies.   Assessment / Plan:     Visit Diagnoses: Multiple joint pain -patient complains of pain in her bilateral hands, bilateral knee joints and her feet.  No synovitis was noted.  All autoimmune work-up has been negative.  04/07/20: ANA-, CCP<16, CRP<1, ESR 12, TSH 2.42, T4 0.80, Hep C ab-, iron panel WNL  Pain in both hands -clinical findings are consistent with osteoarthritis.  She has bilateral CMC thickening, PIP and DIP thickening.  Joint protection muscle strengthening was discussed.  Exercises were demonstrated in the office and a handout was given.  A list of natural anti-inflammatories was given.  Plan: XR Hand 2 View Right, XR Hand 2 View Left.  X-rays were consistent with osteoarthritis.  Paresthesia of both hands-she may benefit from getting a nerve conduction velocity test.  Chronic pain of right knee -she complains of pain in her bilateral knees.  More discomfort in her right knee.  She states she has had aspiration of a cyst on her right knee.  On chart review I found that there was a superficial cyst which was drained by Dr. Tamala Julian in the past.  She has some warmth on palpation of her right knee joint.  A handout on knee exercises was given.  Plan: XR KNEE 3 VIEW RIGHT.  X-ray was consistent with chondromalacia patella.  Patient has tried anti-inflammatories and has been exercising on a regular basis without much relief.  She has intermittent swelling.  She has chronic discomfort.  Will schedule MRI to rule out internal derangement.  Natural anti-inflammatories were discussed.  Cyst of right knee joint-she has had a superficial cyst which was drained by Dr. Tamala Julian in the past x2.  Pes cavus-she has severe pes cavus bilaterally.  She has calluses on the balls of her  feet.  She has dorsal spurring.  Osteoarthritic changes were noted in her feet.  Proper fitting shoes with arch support were emphasized.  She will also benefit from metatarsal pads.  I have advised her to schedule an appointment with a podiatrist.  Subluxation of tendon of long head of biceps - right-it is currently not symptomatic.  Menopausal and perimenopausal disorder  Orders: Orders Placed This Encounter  Procedures   XR Hand 2 View Right   XR Hand 2 View Left   XR KNEE 3 VIEW RIGHT   MR KNEE RIGHT WO CONTRAST   No orders of the defined types were placed in this encounter.     Follow-Up Instructions: Return in about 1 year (around 07/12/2021) for Osteoarthritis, joint pain.   Bo Merino, MD  Note - This record has been created using Editor, commissioning.  Chart creation errors have been sought, but may not always  have been located. Such creation errors do not reflect on  the standard of medical care.

## 2020-07-12 ENCOUNTER — Ambulatory Visit: Payer: Self-pay

## 2020-07-12 ENCOUNTER — Other Ambulatory Visit: Payer: Self-pay

## 2020-07-12 ENCOUNTER — Ambulatory Visit: Payer: 59 | Admitting: Rheumatology

## 2020-07-12 ENCOUNTER — Encounter: Payer: Self-pay | Admitting: Rheumatology

## 2020-07-12 VITALS — BP 121/69 | HR 71 | Resp 15 | Ht 66.75 in | Wt 150.2 lb

## 2020-07-12 DIAGNOSIS — R202 Paresthesia of skin: Secondary | ICD-10-CM | POA: Diagnosis not present

## 2020-07-12 DIAGNOSIS — M2550 Pain in unspecified joint: Secondary | ICD-10-CM | POA: Diagnosis not present

## 2020-07-12 DIAGNOSIS — M25861 Other specified joint disorders, right knee: Secondary | ICD-10-CM

## 2020-07-12 DIAGNOSIS — M25561 Pain in right knee: Secondary | ICD-10-CM

## 2020-07-12 DIAGNOSIS — M79642 Pain in left hand: Secondary | ICD-10-CM

## 2020-07-12 DIAGNOSIS — G8929 Other chronic pain: Secondary | ICD-10-CM | POA: Diagnosis not present

## 2020-07-12 DIAGNOSIS — M79641 Pain in right hand: Secondary | ICD-10-CM

## 2020-07-12 DIAGNOSIS — Q667 Congenital pes cavus, unspecified foot: Secondary | ICD-10-CM

## 2020-07-12 DIAGNOSIS — S43003A Unspecified subluxation of unspecified shoulder joint, initial encounter: Secondary | ICD-10-CM

## 2020-07-12 DIAGNOSIS — N959 Unspecified menopausal and perimenopausal disorder: Secondary | ICD-10-CM

## 2020-07-12 DIAGNOSIS — S46119A Strain of muscle, fascia and tendon of long head of biceps, unspecified arm, initial encounter: Secondary | ICD-10-CM

## 2020-07-12 NOTE — Patient Instructions (Signed)
Hand Exercises Hand exercises can be helpful for almost anyone. These exercises can strengthen the hands, improve flexibility and movement, and increase blood flow to the hands. These results can make work and daily tasks easier. Hand exercises can be especially helpful for people who have joint pain from arthritis or have nerve damage from overuse (carpal tunnel syndrome). These exercises can also help people who have injured a hand. Exercises Most of these hand exercises are gentle stretching and motion exercises. It is usually safe to do them often throughout the day. Warming up your hands before exercise may help to reduce stiffness. You can do this with gentle massage or by placing your hands in warm water for 10-15 minutes. It is normal to feel some stretching, pulling, tightness, or mild discomfort as you begin new exercises. This will gradually improve. Stop an exercise right away if you feel sudden, severe pain or your pain gets worse. Ask your health care provider which exercises are best for you. Knuckle bend or "claw" fist 1. Stand or sit with your arm, hand, and all five fingers pointed straight up. Make sure to keep your wrist straight during the exercise. 2. Gently bend your fingers down toward your palm until the tips of your fingers are touching the top of your palm. Keep your big knuckle straight and just bend the small knuckles in your fingers. 3. Hold this position for __________ seconds. 4. Straighten (extend) your fingers back to the starting position. Repeat this exercise 5-10 times with each hand. Full finger fist 1. Stand or sit with your arm, hand, and all five fingers pointed straight up. Make sure to keep your wrist straight during the exercise. 2. Gently bend your fingers into your palm until the tips of your fingers are touching the middle of your palm. 3. Hold this position for __________ seconds. 4. Extend your fingers back to the starting position, stretching every  joint fully. Repeat this exercise 5-10 times with each hand. Straight fist 1. Stand or sit with your arm, hand, and all five fingers pointed straight up. Make sure to keep your wrist straight during the exercise. 2. Gently bend your fingers at the big knuckle, where your fingers meet your hand, and the middle knuckle. Keep the knuckle at the tips of your fingers straight and try to touch the bottom of your palm. 3. Hold this position for __________ seconds. 4. Extend your fingers back to the starting position, stretching every joint fully. Repeat this exercise 5-10 times with each hand. Tabletop 1. Stand or sit with your arm, hand, and all five fingers pointed straight up. Make sure to keep your wrist straight during the exercise. 2. Gently bend your fingers at the big knuckle, where your fingers meet your hand, as far down as you can while keeping the small knuckles in your fingers straight. Think of forming a tabletop with your fingers. 3. Hold this position for __________ seconds. 4. Extend your fingers back to the starting position, stretching every joint fully. Repeat this exercise 5-10 times with each hand. Finger spread 1. Place your hand flat on a table with your palm facing down. Make sure your wrist stays straight as you do this exercise. 2. Spread your fingers and thumb apart from each other as far as you can until you feel a gentle stretch. Hold this position for __________ seconds. 3. Bring your fingers and thumb tight together again. Hold this position for __________ seconds. Repeat this exercise 5-10 times with each hand.   Making circles 1. Stand or sit with your arm, hand, and all five fingers pointed straight up. Make sure to keep your wrist straight during the exercise. 2. Make a circle by touching the tip of your thumb to the tip of your index finger. 3. Hold for __________ seconds. Then open your hand wide. 4. Repeat this motion with your thumb and each finger on your  hand. Repeat this exercise 5-10 times with each hand. Thumb motion 1. Sit with your forearm resting on a table and your wrist straight. Your thumb should be facing up toward the ceiling. Keep your fingers relaxed as you move your thumb. 2. Lift your thumb up as high as you can toward the ceiling. Hold for __________ seconds. 3. Bend your thumb across your palm as far as you can, reaching the tip of your thumb for the small finger (pinkie) side of your palm. Hold for __________ seconds. Repeat this exercise 5-10 times with each hand. Grip strengthening  1. Hold a stress ball or other soft ball in the middle of your hand. 2. Slowly increase the pressure, squeezing the ball as much as you can without causing pain. Think of bringing the tips of your fingers into the middle of your palm. All of your finger joints should bend when doing this exercise. 3. Hold your squeeze for __________ seconds, then relax. Repeat this exercise 5-10 times with each hand. Contact a health care provider if:  Your hand pain or discomfort gets much worse when you do an exercise.  Your hand pain or discomfort does not improve within 2 hours after you exercise. If you have any of these problems, stop doing these exercises right away. Do not do them again unless your health care provider says that you can. Get help right away if:  You develop sudden, severe hand pain or swelling. If this happens, stop doing these exercises right away. Do not do them again unless your health care provider says that you can. This information is not intended to replace advice given to you by your health care provider. Make sure you discuss any questions you have with your health care provider. Document Revised: 04/01/2019 Document Reviewed: 12/10/2018 Elsevier Patient Education  2020 [employer]  Journal for Nurse Practitioners, 15(4), 263-267. Retrieved September 28, 2018 from http://clinicalkey.com/nursing">  Knee Exercises Ask your  health care provider which exercises are safe for you. Do exercises exactly as told by your health care provider and adjust them as directed. It is normal to feel mild stretching, pulling, tightness, or discomfort as you do these exercises. Stop right away if you feel sudden pain or your pain gets worse. Do not begin these exercises until told by your health care provider. Stretching and range-of-motion exercises These exercises warm up your muscles and joints and improve the movement and flexibility of your knee. These exercises also help to relieve pain and swelling. Knee extension, prone 1. Lie on your abdomen (prone position) on a bed. 2. Place your left / right knee just beyond the edge of the surface so your knee is not on the bed. You can put a towel under your left / right thigh just above your kneecap for comfort. 3. Relax your leg muscles and allow gravity to straighten your knee (extension). You should feel a stretch behind your left / right knee. 4. Hold this position for __________ seconds. 5. Scoot up so your knee is supported between repetitions. Repeat __________ times. Complete this exercise __________ times a day.   Knee flexion, active  1. Lie on your back with both legs straight. If this causes back discomfort, bend your left / right knee so your foot is flat on the floor. 2. Slowly slide your left / right heel back toward your buttocks. Stop when you feel a gentle stretch in the front of your knee or thigh (flexion). 3. Hold this position for __________ seconds. 4. Slowly slide your left / right heel back to the starting position. Repeat __________ times. Complete this exercise __________ times a day. Quadriceps stretch, prone  1. Lie on your abdomen on a firm surface, such as a bed or padded floor. 2. Bend your left / right knee and hold your ankle. If you cannot reach your ankle or pant leg, loop a belt around your foot and grab the belt instead. 3. Gently pull your heel  toward your buttocks. Your knee should not slide out to the side. You should feel a stretch in the front of your thigh and knee (quadriceps). 4. Hold this position for __________ seconds. Repeat __________ times. Complete this exercise __________ times a day. Hamstring, supine 1. Lie on your back (supine position). 2. Loop a belt or towel over the ball of your left / right foot. The ball of your foot is on the walking surface, right under your toes. 3. Straighten your left / right knee and slowly pull on the belt to raise your leg until you feel a gentle stretch behind your knee (hamstring). ? Do not let your knee bend while you do this. ? Keep your other leg flat on the floor. 4. Hold this position for __________ seconds. Repeat __________ times. Complete this exercise __________ times a day. Strengthening exercises These exercises build strength and endurance in your knee. Endurance is the ability to use your muscles for a long time, even after they get tired. Quadriceps, isometric This exercise stretches the muscles in front of your thigh (quadriceps) without moving your knee joint (isometric). 1. Lie on your back with your left / right leg extended and your other knee bent. Put a rolled towel or small pillow under your knee if told by your health care provider. 2. Slowly tense the muscles in the front of your left / right thigh. You should see your kneecap slide up toward your hip or see increased dimpling just above the knee. This motion will push the back of the knee toward the floor. 3. For __________ seconds, hold the muscle as tight as you can without increasing your pain. 4. Relax the muscles slowly and completely. Repeat __________ times. Complete this exercise __________ times a day. Straight leg raises This exercise stretches the muscles in front of your thigh (quadriceps) and the muscles that move your hips (hip flexors). 1. Lie on your back with your left / right leg extended and  your other knee bent. 2. Tense the muscles in the front of your left / right thigh. You should see your kneecap slide up or see increased dimpling just above the knee. Your thigh may even shake a bit. 3. Keep these muscles tight as you raise your leg 4-6 inches (10-15 cm) off the floor. Do not let your knee bend. 4. Hold this position for __________ seconds. 5. Keep these muscles tense as you lower your leg. 6. Relax your muscles slowly and completely after each repetition. Repeat __________ times. Complete this exercise __________ times a day. Hamstring, isometric 1. Lie on your back on a firm surface. 2. Bend your   left / right knee about __________ degrees. 3. Dig your left / right heel into the surface as if you are trying to pull it toward your buttocks. Tighten the muscles in the back of your thighs (hamstring) to "dig" as hard as you can without increasing any pain. 4. Hold this position for __________ seconds. 5. Release the tension gradually and allow your muscles to relax completely for __________ seconds after each repetition. Repeat __________ times. Complete this exercise __________ times a day. Hamstring curls If told by your health care provider, do this exercise while wearing ankle weights. Begin with __________ lb weights. Then increase the weight by 1 lb (0.5 kg) increments. Do not wear ankle weights that are more than __________ lb. 1. Lie on your abdomen with your legs straight. 2. Bend your left / right knee as far as you can without feeling pain. Keep your hips flat against the floor. 3. Hold this position for __________ seconds. 4. Slowly lower your leg to the starting position. Repeat __________ times. Complete this exercise __________ times a day. Squats This exercise strengthens the muscles in front of your thigh and knee (quadriceps). 1. Stand in front of a table, with your feet and knees pointing straight ahead. You may rest your hands on the table for balance but  not for support. 2. Slowly bend your knees and lower your hips like you are going to sit in a chair. ? Keep your weight over your heels, not over your toes. ? Keep your lower legs upright so they are parallel with the table legs. ? Do not let your hips go lower than your knees. ? Do not bend lower than told by your health care provider. ? If your knee pain increases, do not bend as low. 3. Hold the squat position for __________ seconds. 4. Slowly push with your legs to return to standing. Do not use your hands to pull yourself to standing. Repeat __________ times. Complete this exercise __________ times a day. Wall slides This exercise strengthens the muscles in front of your thigh and knee (quadriceps). 1. Lean your back against a smooth wall or door, and walk your feet out 18-24 inches (46-61 cm) from it. 2. Place your feet hip-width apart. 3. Slowly slide down the wall or door until your knees bend __________ degrees. Keep your knees over your heels, not over your toes. Keep your knees in line with your hips. 4. Hold this position for __________ seconds. Repeat __________ times. Complete this exercise __________ times a day. Straight leg raises This exercise strengthens the muscles that rotate the leg at the hip and move it away from your body (hip abductors). 1. Lie on your side with your left / right leg in the top position. Lie so your head, shoulder, knee, and hip line up. You may bend your bottom knee to help you keep your balance. 2. Roll your hips slightly forward so your hips are stacked directly over each other and your left / right knee is facing forward. 3. Leading with your heel, lift your top leg 4-6 inches (10-15 cm). You should feel the muscles in your outer hip lifting. ? Do not let your foot drift forward. ? Do not let your knee roll toward the ceiling. 4. Hold this position for __________ seconds. 5. Slowly return your leg to the starting position. 6. Let your muscles  relax completely after each repetition. Repeat __________ times. Complete this exercise __________ times a day. Straight leg raises This exercise   stretches the muscles that move your hips away from the front of the pelvis (hip extensors). 1. Lie on your abdomen on a firm surface. You can put a pillow under your hips if that is more comfortable. 2. Tense the muscles in your buttocks and lift your left / right leg about 4-6 inches (10-15 cm). Keep your knee straight as you lift your leg. 3. Hold this position for __________ seconds. 4. Slowly lower your leg to the starting position. 5. Let your leg relax completely after each repetition. Repeat __________ times. Complete this exercise __________ times a day. This information is not intended to replace advice given to you by your health care provider. Make sure you discuss any questions you have with your health care provider. Document Revised: 09/29/2018 Document Reviewed: 09/29/2018 Elsevier Patient Education  2020 [employer] Inc[employer]  

## 2020-08-01 ENCOUNTER — Encounter: Payer: Self-pay | Admitting: Rheumatology

## 2020-08-02 ENCOUNTER — Ambulatory Visit: Payer: 59 | Admitting: Rheumatology

## 2020-08-09 ENCOUNTER — Telehealth: Payer: Self-pay | Admitting: *Deleted

## 2020-08-09 ENCOUNTER — Ambulatory Visit: Payer: 59 | Admitting: Rheumatology

## 2020-08-09 DIAGNOSIS — M25561 Pain in right knee: Secondary | ICD-10-CM

## 2020-08-09 DIAGNOSIS — G8929 Other chronic pain: Secondary | ICD-10-CM

## 2020-08-09 NOTE — Telephone Encounter (Signed)
Patient advised we received MRI results which were reviewed by Dr. Corliss Skains. Patient advised the results shows inferior articular surface tear Involving the posterior horn with small flap component. Patient advised Dr. Corliss Skains recommends an orthopedic referral. Patien tadvised and referral placed.

## 2020-08-16 ENCOUNTER — Ambulatory Visit (INDEPENDENT_AMBULATORY_CARE_PROVIDER_SITE_OTHER): Payer: 59

## 2020-08-16 ENCOUNTER — Telehealth: Payer: Self-pay

## 2020-08-16 ENCOUNTER — Other Ambulatory Visit: Payer: Self-pay

## 2020-08-16 DIAGNOSIS — Z23 Encounter for immunization: Secondary | ICD-10-CM

## 2020-08-16 MED ORDER — ZOLPIDEM TARTRATE 5 MG PO TABS: 5.0000 mg | ORAL_TABLET | Freq: Every evening | ORAL | 0 refills | Status: DC | PRN

## 2020-08-16 NOTE — Telephone Encounter (Signed)
Patient came into the office today for her Flu vaccine and requested her Ambien to be filled. She sent a MyChart message on 08/14/2020. Please see for further information since routed to you. Thanks!

## 2020-08-16 NOTE — Telephone Encounter (Signed)
Sent in electronically . # 20  s

## 2020-08-16 NOTE — Telephone Encounter (Signed)
see other threat  Sent ins

## 2020-09-07 ENCOUNTER — Encounter: Payer: Self-pay | Admitting: Orthopaedic Surgery

## 2020-09-07 ENCOUNTER — Ambulatory Visit: Payer: 59 | Admitting: Orthopaedic Surgery

## 2020-09-07 ENCOUNTER — Other Ambulatory Visit: Payer: Self-pay

## 2020-09-07 DIAGNOSIS — M1711 Unilateral primary osteoarthritis, right knee: Secondary | ICD-10-CM

## 2020-09-07 DIAGNOSIS — M23203 Derangement of unspecified medial meniscus due to old tear or injury, right knee: Secondary | ICD-10-CM

## 2020-09-07 DIAGNOSIS — M23209 Derangement of unspecified meniscus due to old tear or injury, unspecified knee: Secondary | ICD-10-CM | POA: Insufficient documentation

## 2020-09-07 NOTE — Progress Notes (Signed)
Office Visit Note   Patient: Christina Wilkins           Date of Birth: 02-12-1956           MRN: [id number] Visit Date: 09/07/2020              Requested by: Pollyann Savoy, MD [address] [place],  [place] [address] PCP: Madelin Headings, MD   Assessment & Plan: Visit Diagnoses:  1. Old tear of medial meniscus of right knee, unspecified tear type   2. Arthritis of right knee     Plan:  #1: At this time she is not interested in any type of surgical repair or injections. #2: She can follow back up in a time for corticosteroid injections or possibly viscosupplementation if desired.  Follow-Up Instructions: Return if symptoms worsen or fail to improve.   Orders:  No orders of the defined types were placed in this encounter.  No orders of the defined types were placed in this encounter.     Procedures: No procedures performed   Clinical Data: No additional findings.   Subjective: Chief Complaint  Patient presents with  . Right Knee - Pain   HPI Patient presents today for right knee pain. She states that she has been having pain for around 3 years. Swelling. She states that the pain does not limit her from doing anything. She saw Dr.Deveshwar and had x-rays taken. She also states that she had an MRI done. She takes Naproxen twice daily.    Review of Systems   Objective: Vital Signs: Ht 5\' 7"  (1.702 m)   Wt 145 lb (65.8 kg)   BMI 22.71 kg/m   Physical Exam Constitutional:      Appearance: She is well-developed.  Eyes:     Pupils: Pupils are equal, round, and reactive to light.  Pulmonary:     Effort: Pulmonary effort is normal.  Skin:    General: Skin is warm and dry.  Neurological:     Mental Status: She is alert and oriented to person, place, and time.  Psychiatric:        Behavior: Behavior normal.     Ortho Exam  Range of motion from 0 degrees to 115 degrees.  Trace effusion.  Mild patellofemoral crepitance.  Good hip  motion.  Tender over the medial joint line of the knee.  Negative drawer.  Specialty Comments:  No specialty comments available.  Imaging: No significant medial or lateral compartment narrowing was noted.   Intercondylar osteophytes were noted. Moderate patellofemoral narrowing  was noted. There is radiographic progression of chondromalacia patella  when compared to the x-rays of December 10, 2018.   Impression: These findings are consistent with moderate chondromalacia  patella.   PMFS History: Current Outpatient Medications  Medication Sig Dispense Refill  . glucosamine-chondroitin 500-400 MG tablet Take 1 tablet by mouth 3 (three) times daily.    December 12, 2018 loratadine (CLARITIN) 10 MG tablet Take 10 mg by mouth daily.    . Misc Natural Products (TART CHERRY ADVANCED PO) Take by mouth.    . Multiple Vitamin (MULTIVITAMIN) tablet Take 1 tablet by mouth daily.    . naproxen sodium (ALEVE) 220 MG tablet Take 220 mg by mouth 2 (two) times daily.     . TURMERIC PO Take 1 capsule by mouth daily.    Marland Kitchen zolpidem (AMBIEN) 5 MG tablet Take 1 tablet (5 mg total) by mouth at bedtime as needed. for sleep travel 20 tablet 0  No current facility-administered medications for this visit.    Patient Active Problem List   Diagnosis Date Noted  . Chronic meniscal tear of knee 09/07/2020  . Arthritis of right knee 09/07/2020  . Subluxation of tendon of long head of biceps 02/25/2018  . Cyst of knee joint, right 02/25/2018  . Paresthesia of both hands 06/16/2015  . Menopausal and perimenopausal disorder 04/06/2013  . Medication management 04/06/2013  . H/O sleep disturbance 04/12/2012  . THYROIDITIS 09/14/2010  . MENOPAUSAL SYNDROME 09/14/2010  . THYROID STIMULATING HORMONE, ABNORMAL 08/29/2010  . ALLERGIC RHINITIS 08/29/2010   Past Medical History:  Diagnosis Date  . Allergic rhinitis    seasonal summer cough takes claritin  . Varicella     Family History  Problem Relation Age of Onset  .  Breast cancer Mother 15  . Hypothyroidism Mother   . Arthritis Mother   . Alcohol abuse Father   . Hypertension Father   . Allergies Sister   . Alcohol abuse Brother     Past Surgical History:  Procedure Laterality Date  . BUNIONECTOMY    . right eye scar tissue on cornea removed  April 2011   Social History   Occupational History  . Not on file  Tobacco Use  . Smoking status: Never Smoker  . Smokeless tobacco: Never Used  Vaping Use  . Vaping Use: Never used  Substance and Sexual Activity  . Alcohol use: Yes    Comment: per pt a beer once a week   . Drug use: Never  . Sexual activity: Not on file

## 2020-11-14 ENCOUNTER — Other Ambulatory Visit: Payer: Self-pay | Admitting: Internal Medicine

## 2020-11-14 DIAGNOSIS — Z1231 Encounter for screening mammogram for malignant neoplasm of breast: Secondary | ICD-10-CM

## 2020-11-20 ENCOUNTER — Other Ambulatory Visit: Payer: Self-pay

## 2020-11-20 ENCOUNTER — Ambulatory Visit
Admission: RE | Admit: 2020-11-20 | Discharge: 2020-11-20 | Disposition: A | Discharge status: Home / Self Care | Payer: 59 | Source: Ambulatory Visit

## 2020-11-20 DIAGNOSIS — Z1231 Encounter for screening mammogram for malignant neoplasm of breast: Secondary | ICD-10-CM

## 2020-11-29 ENCOUNTER — Other Ambulatory Visit: Payer: Self-pay

## 2020-11-29 ENCOUNTER — Encounter: Payer: Self-pay | Admitting: Orthopaedic Surgery

## 2020-11-29 ENCOUNTER — Ambulatory Visit: Payer: 59 | Admitting: Orthopaedic Surgery

## 2020-11-29 VITALS — Ht 67.0 in | Wt 145.0 lb

## 2020-11-29 DIAGNOSIS — M1711 Unilateral primary osteoarthritis, right knee: Secondary | ICD-10-CM

## 2020-11-29 MED ORDER — LIDOCAINE HCL 1 % IJ SOLN
2.0000 mL | INTRAMUSCULAR | Status: AC | PRN
  Administered 2020-11-29: 2 mL

## 2020-11-29 MED ORDER — METHYLPREDNISOLONE ACETATE 40 MG/ML IJ SUSP
40.0000 mg | INTRAMUSCULAR | Status: AC | PRN
  Administered 2020-11-29: 40 mg via INTRA_ARTICULAR

## 2020-11-29 NOTE — Progress Notes (Signed)
Office Visit Note   Patient: Christina Wilkins           Date of Birth: 1956-02-02           MRN: [id number] Visit Date: 11/29/2020              Requested by: Madelin Headings, MD [address] [place],  [place] [address] PCP: Madelin Headings, MD   Assessment & Plan: Visit Diagnoses:  1. Arthritis of right knee     Plan: Probable primary osteoarthritis right knee.  Evaluated in September of this year with x-rays and MRI scan consistent with degenerative changes in all 3 compartments.  In the interim she has developed some swelling along the lateral aspect of her knee.  This was aspirated with typical ganglionic fluid.  The MRI scan suggested there was an area of synovitis between the proximal tibia and the capsule.  This obviously has increased in size related to the fluid.  Long discussion regarding the arthritis and what she may experience as time progresses.  Many questions that were answered in detail.  No further questions.  Happy to know that the mass was just cystic  Follow-Up Instructions: Return if symptoms worsen or fail to improve.   Orders:  Orders Placed This Encounter  Procedures  . Large Joint Inj: R knee   No orders of the defined types were placed in this encounter.     Procedures: Large Joint Inj: R knee on 11/29/2020 4:09 PM Indications: pain and diagnostic evaluation Details: 25 G 1.5 in needle, anteromedial approach  Arthrogram: No  Medications: 2 mL lidocaine 1 %; 40 mg methylPREDNISolone acetate 40 MG/ML  Aspirated a cyst along the lateral aspect of the right knee of approximately 4 cc of typical thickened ganglionic clear fluid.  Prior MRI scan suggested there may be an area of synovitis between the lateral femoral condyle and the capsule without evidence of a meniscal tear. Procedure, treatment alternatives, risks and benefits explained, specific risks discussed. Consent was given by the patient. Immediately prior to procedure a time out was  called to verify the correct patient, procedure, equipment, support staff and site/side marked as required. Patient was prepped and draped in the usual sterile fashion.       Clinical Data: No additional findings.   Subjective: Chief Complaint  Patient presents with  . Left Knee - Pain  . Right Knee - Pain  Patient presents today for a follow up on her right knee. She was here in September to discuss her right knee pain. She said that since her last visit she has a large knot on the lateral side of her right knee that has more than doubled in size since her last visit. She said that the knot itself is not painful, but she is very concerned about it. She also has an area on the medial side of her left knee that is larger than before. She takes Tylenol for her knee pain daily.  HPI  Review of Systems   Objective: Vital Signs: Ht 5\' 7"  (1.702 m)   Wt 145 lb (65.8 kg)   BMI 22.71 kg/m   Physical Exam Constitutional:      Appearance: She is well-developed.  Eyes:     Pupils: Pupils are equal, round, and reactive to light.  Pulmonary:     Effort: Pulmonary effort is normal.  Skin:    General: Skin is warm and dry.  Neurological:     Mental Status:  She is alert and oriented to person, place, and time.  Psychiatric:        Behavior: Behavior normal.     Ortho Exam awake alert and oriented x3.  Comfortable sitting.  Full extension right knee.  No obvious effusion.  Flexed over 105 degrees without instability.  Does have a 1 to 2 cm mass along the lateral proximal tibial plateau without any pain.  No redness or ecchymosis.  No popliteal pain or mass.  No medial or lateral joint pain.  Some patella crepitation but no pain with compression.  Specialty Comments:  No specialty comments available.  Imaging: No results found.   PMFS History: Patient Active Problem List   Diagnosis Date Noted  . Chronic meniscal tear of knee 09/07/2020  . Arthritis of right knee 09/07/2020  .  Subluxation of tendon of long head of biceps 02/25/2018  . Cyst of knee joint, right 02/25/2018  . Paresthesia of both hands 06/16/2015  . Menopausal and perimenopausal disorder 04/06/2013  . Medication management 04/06/2013  . H/O sleep disturbance 04/12/2012  . THYROIDITIS 09/14/2010  . MENOPAUSAL SYNDROME 09/14/2010  . THYROID STIMULATING HORMONE, ABNORMAL 08/29/2010  . ALLERGIC RHINITIS 08/29/2010   Past Medical History:  Diagnosis Date  . Allergic rhinitis    seasonal summer cough takes claritin  . Varicella     Family History  Problem Relation Age of Onset  . Breast cancer Mother 28  . Hypothyroidism Mother   . Arthritis Mother   . Alcohol abuse Father   . Hypertension Father   . Allergies Sister   . Alcohol abuse Brother     Past Surgical History:  Procedure Laterality Date  . BUNIONECTOMY    . right eye scar tissue on cornea removed  April 2011   Social History   Occupational History  . Not on file  Tobacco Use  . Smoking status: Never Smoker  . Smokeless tobacco: Never Used  Vaping Use  . Vaping Use: Never used  Substance and Sexual Activity  . Alcohol use: Yes    Comment: per pt a beer once a week   . Drug use: Never  . Sexual activity: Not on file

## 2021-01-02 ENCOUNTER — Ambulatory Visit
Admission: RE | Admit: 2021-01-02 | Discharge: 2021-01-02 | Disposition: A | Discharge status: Home / Self Care | Payer: 59 | Source: Ambulatory Visit | Attending: Internal Medicine | Admitting: Internal Medicine

## 2021-01-02 ENCOUNTER — Other Ambulatory Visit: Payer: Self-pay

## 2021-01-02 ENCOUNTER — Other Ambulatory Visit: Payer: Self-pay | Admitting: Internal Medicine

## 2021-01-02 DIAGNOSIS — Z1231 Encounter for screening mammogram for malignant neoplasm of breast: Secondary | ICD-10-CM

## 2021-02-26 ENCOUNTER — Telehealth: Payer: Self-pay

## 2021-02-26 NOTE — Telephone Encounter (Signed)
Tried to call patient. No answer. Left message for patient to call back to schedule.

## 2021-02-26 NOTE — Telephone Encounter (Signed)
Patient would like to know if she can be worked into the schedule for a sprained right ankle.  CB# [phone].  Please advise.  Thank you.

## 2021-02-27 ENCOUNTER — Telehealth: Payer: Self-pay

## 2021-02-27 NOTE — Telephone Encounter (Signed)
Patient called regarding a appointment for her sprained ankle I was able to get her worked into Dr.Whitfields scheduled this afternoon at 2:15 patient stated she wouldn't be able to make it due to having to be at a meeting for her job patient stated she can wait to be seen 3/16 instead due to her situation not being that serious call back:[phone]

## 2021-03-07 ENCOUNTER — Other Ambulatory Visit: Payer: Self-pay

## 2021-03-07 ENCOUNTER — Ambulatory Visit: Payer: 59 | Admitting: Orthopaedic Surgery

## 2021-03-07 ENCOUNTER — Encounter: Payer: Self-pay | Admitting: Orthopaedic Surgery

## 2021-03-07 ENCOUNTER — Ambulatory Visit: Payer: Self-pay

## 2021-03-07 VITALS — Ht 67.0 in | Wt 145.0 lb

## 2021-03-07 DIAGNOSIS — G8929 Other chronic pain: Secondary | ICD-10-CM | POA: Diagnosis not present

## 2021-03-07 DIAGNOSIS — M25571 Pain in right ankle and joints of right foot: Secondary | ICD-10-CM

## 2021-03-07 DIAGNOSIS — M25512 Pain in left shoulder: Secondary | ICD-10-CM | POA: Diagnosis not present

## 2021-03-07 NOTE — Progress Notes (Signed)
Office Visit Note   Patient: Christina Wilkins           Date of Birth: 05-Apr-1956           MRN: [id number] Visit Date: 03/07/2021              Requested by: Christina Headings, MD [address] [place],  [place] [address] PCP: Christina Headings, MD   Assessment & Plan: Visit Diagnoses:  1. Chronic left shoulder pain   2. Pain in right ankle and joints of right foot     Plan: Christina Wilkins has 2 problems.  The first is with her left shoulder.  She has had insidious onset months ago and has reached a point over the last 6 or 8 weeks where she is having difficulty sleeping on that side or using any overhead motion.  It has been a compromise of her activities.  X-rays are negative but I am concerned she may have a rotator cuff tear or biceps pathology as she had tenderness over the long head of the biceps with a positive Speed sign.  Will order an MRI scan Right ankle pain occurred about 3 to 4 weeks ago while she was hiking and twisted her ankle.  Still having some pain and some prominence along the lateral aspect of her ankle.  Films demonstrate a large area of ectopic calcification just distal to the tip of the lateral malleolus.  No fracture.  I suspect that she is probably had an exacerbation of her chronic problem is the calcification appears old.  I think she just had a sprain and will apply ankle support  Follow-Up Instructions: No follow-ups on file.   Orders:  Orders Placed This Encounter  Procedures  . XR Shoulder Left  . XR Ankle Complete Right  . MR Shoulder Left w/o contrast   No orders of the defined types were placed in this encounter.     Procedures: No procedures performed   Clinical Data: No additional findings.   Subjective: Chief Complaint  Patient presents with  . Left Shoulder - Pain  . Right Ankle - Pain, Injury    DOI 02/11/2021  Patient presents today for left shoulder and right ankle pain. She states that her left shoulder has bothered her  for quite some time. She has pain posteriorly and radiates into her proximal arm. She has limited range of motion. No numbness or tingling. She states that it hurts constantly, but certain movements cause an intense shooting pain. She takes Tylenol as needed. She is also here to have her right ankle checked today. She states that she was hiking and rolled her ankle on 02/11/2021. She states that it is painful and tender laterally. She said that it remains swollen and larger than the other side.   HPI  Review of Systems   Objective: Vital Signs: Ht 5\' 7"  (1.702 m)   Wt 145 lb (65.8 kg)   BMI 22.71 kg/m   Physical Exam Constitutional:      Appearance: She is well-developed.  Eyes:     Pupils: Pupils are equal, round, and reactive to light.  Pulmonary:     Effort: Pulmonary effort is normal.  Skin:    General: Skin is warm and dry.  Neurological:     Mental Status: She is alert and oriented to person, place, and time.  Psychiatric:        Behavior: Behavior normal.     Ortho Exam left shoulder with  a positive Speed sign.  Minimally positive impingement testing particularly in the extreme of external rotation.  Negative empty can testing.  There is some tenderness over the biceps tendon anteriorly and in the anterior subacromial region.  No pain at the [facility] joint.  Good strength.  Able to place arm fully overhead but with some discomfort  Right ankle with some hypertrophic changes laterally and mild tenderness over the anterior talofibular ligament.  No instability with inversion eversion stress or with an anterior drawer sign.  Skin intact.  No redness or ecchymosis. Specialty Comments:  No specialty comments available.  Imaging: XR Ankle Complete Right  Result Date: 03/07/2021 Films of the right ankle obtained in several projections.  Ankle mortise is intact.  No evidence of a fracture.  There is a large area of ectopic calcification just distal to the tip of the lateral malleolus.   It appears chronic.  Patient is symptomatic in that area.  XR Shoulder Left  Result Date: 03/07/2021 Films of the left shoulder pain in several projections.  No ectopic calcification.  Normal space between the humeral head and the acromion.  Humeral head is centered about the glenoid without evidence of glenohumeral arthritis.  Mild degenerative changes at the [facility] joint.  Type II acromium    PMFS History: Patient Active Problem List   Diagnosis Date Noted  . Pain in left shoulder 03/07/2021  . Pain in right ankle and joints of right foot 03/07/2021  . Chronic meniscal tear of knee 09/07/2020  . Arthritis of right knee 09/07/2020  . Subluxation of tendon of long head of biceps 02/25/2018  . Cyst of knee joint, right 02/25/2018  . Paresthesia of both hands 06/16/2015  . Menopausal and perimenopausal disorder 04/06/2013  . Medication management 04/06/2013  . H/O sleep disturbance 04/12/2012  . THYROIDITIS 09/14/2010  . MENOPAUSAL SYNDROME 09/14/2010  . THYROID STIMULATING HORMONE, ABNORMAL 08/29/2010  . ALLERGIC RHINITIS 08/29/2010   Past Medical History:  Diagnosis Date  . Allergic rhinitis    seasonal summer cough takes claritin  . Varicella     Family History  Problem Relation Age of Onset  . Breast cancer Mother 50  . Hypothyroidism Mother   . Arthritis Mother   . Alcohol abuse Father   . Hypertension Father   . Allergies Sister   . Alcohol abuse Brother     Past Surgical History:  Procedure Laterality Date  . BUNIONECTOMY    . right eye scar tissue on cornea removed  April 2011   Social History   Occupational History  . Not on file  Tobacco Use  . Smoking status: Never Smoker  . Smokeless tobacco: Never Used  Vaping Use  . Vaping Use: Never used  Substance and Sexual Activity  . Alcohol use: Yes    Comment: per pt a beer once a week   . Drug use: Never  . Sexual activity: Not on file

## 2021-03-22 ENCOUNTER — Telehealth: Payer: Self-pay | Admitting: Orthopaedic Surgery

## 2021-03-22 NOTE — Telephone Encounter (Signed)
Pt called stating she has an MRI on 03/27/21 but her insurance hasn't approved it yet. Pt would like our office to see if they could get the authorization from [facility] and would like a CB to update her after we reach out.   [phone]

## 2021-03-22 NOTE — Telephone Encounter (Signed)
Called and left vm informing her [employer] has been authorized to have MRI.

## 2021-03-27 ENCOUNTER — Ambulatory Visit
Admission: RE | Admit: 2021-03-27 | Discharge: 2021-03-27 | Disposition: A | Discharge status: Home / Self Care | Payer: 59 | Source: Ambulatory Visit | Attending: Orthopaedic Surgery | Admitting: Orthopaedic Surgery

## 2021-03-27 ENCOUNTER — Other Ambulatory Visit: Payer: Self-pay

## 2021-03-27 DIAGNOSIS — G8929 Other chronic pain: Secondary | ICD-10-CM

## 2021-03-27 DIAGNOSIS — M25512 Pain in left shoulder: Secondary | ICD-10-CM

## 2021-04-04 ENCOUNTER — Encounter: Payer: Self-pay | Admitting: Orthopaedic Surgery

## 2021-04-04 ENCOUNTER — Ambulatory Visit: Payer: 59 | Admitting: Orthopaedic Surgery

## 2021-04-04 ENCOUNTER — Other Ambulatory Visit: Payer: Self-pay

## 2021-04-04 VITALS — Ht 67.0 in | Wt 145.0 lb

## 2021-04-04 DIAGNOSIS — M25512 Pain in left shoulder: Secondary | ICD-10-CM

## 2021-04-04 DIAGNOSIS — G8929 Other chronic pain: Secondary | ICD-10-CM | POA: Diagnosis not present

## 2021-04-04 NOTE — Progress Notes (Signed)
Office Visit Note   Patient: Christina Wilkins           Date of Birth: Apr 26, 1956           MRN: [id number] Visit Date: 04/04/2021              Requested by: Madelin Headings, MD [address] [place],  [place] [address] PCP: Madelin Headings, MD   Assessment & Plan: Visit Diagnoses:  1. Chronic left shoulder pain     Plan: Mrs. Christina Wilkins had an MRI scan of her left shoulder demonstrating intrasubstance fissuring versus partial-thickness articular surface tearing of the upper portion of the distal subscapularis.  There is no full-thickness tear identified.  There was mild subscapularis tendinopathy.  Muscles are unremarkable.  Biceps long head was unremarkable tear.  There was mild spurring and moderate degenerative subcortical marrow edema at the [facility] joint with a type II acromium and mild degenerative chondral thinning of the glenohumeral joint but no significant glenohumeral spurring.  No abnormalities within the humeral head.  There was poor delineation of the upper portion of the middle glenohumeral ligament which may or may not be significant.  There is an element of these of capsulitis as she has difficulty fastening her bra with her left hand.  I think a course of physical therapy would be helpful.  [facility] will [facility] as [facility] close to her home and then have her return over time if necessary.  Always consider subacromial or intra-articular cortisone injection.  In addition she has some degenerative changes in both of her hands and particularly at the left hand DIP joint of the little finger.  I showed her what it looks like on the models and she is work on either the using Voltaren gel if necessary her range of motion exercises.  She does see Dr. Corliss Skains  Follow-Up Instructions: Return if symptoms worsen or fail to improve.   Orders:  Orders Placed This Encounter  Procedures  . Ambulatory referral to Physical Therapy   No orders of the defined  types were placed in this encounter.     Procedures: No procedures performed   Clinical Data: No additional findings.   Subjective: Chief Complaint  Patient presents with  . Left Shoulder - Follow-up    MRI review  Patient presents today for follow up on her left shoulder. She had an MRI and is here today for those results.   HPI  Review of Systems   Objective: Vital Signs: Ht 5\' 7"  (1.702 m)   Wt 145 lb (65.8 kg)   BMI 22.71 kg/m   Physical Exam Constitutional:      Appearance: She is well-developed.  Eyes:     Pupils: Pupils are equal, round, and reactive to light.  Pulmonary:     Effort: Pulmonary effort is normal.  Skin:    General: Skin is warm and dry.  Neurological:     Mental Status: She is alert and oriented to person, place, and time.  Psychiatric:        Behavior: Behavior normal.     Ortho Exam awake alert and oriented x3.  Comfortable sitting.  Full quick overhead motion in flexion left shoulder but difficulty touching the middle of her back with internal rotation.  Very minimal impingement testing.  No snapping of the biceps tendon and no pain along the biceps today.  No pain along the anterior subacromial region.  She does note  that on occasion she has some pain in the lateral subacromial region.  No grinding or crepitation.  No loss of motion.  Good grip and good release.  Does have Heberden's nodes diffusely in both hands but more about the little finger DIP joint.  Neurologically intact  Specialty Comments:  No specialty comments available.  Imaging: No results found.   PMFS History: Patient Active Problem List   Diagnosis Date Noted  . Pain in left shoulder 03/07/2021  . Pain in right ankle and joints of right foot 03/07/2021  . Chronic meniscal tear of knee 09/07/2020  . Arthritis of right knee 09/07/2020  . Subluxation of tendon of long head of biceps 02/25/2018  . Cyst of knee joint, right 02/25/2018  . Paresthesia of both hands  06/16/2015  . Menopausal and perimenopausal disorder 04/06/2013  . Medication management 04/06/2013  . H/O sleep disturbance 04/12/2012  . THYROIDITIS 09/14/2010  . MENOPAUSAL SYNDROME 09/14/2010  . THYROID STIMULATING HORMONE, ABNORMAL 08/29/2010  . ALLERGIC RHINITIS 08/29/2010   Past Medical History:  Diagnosis Date  . Allergic rhinitis    seasonal summer cough takes claritin  . Varicella     Family History  Problem Relation Age of Onset  . Breast cancer Mother 7  . Hypothyroidism Mother   . Arthritis Mother   . Alcohol abuse Father   . Hypertension Father   . Allergies Sister   . Alcohol abuse Brother     Past Surgical History:  Procedure Laterality Date  . BUNIONECTOMY    . right eye scar tissue on cornea removed  April 2011   Social History   Occupational History  . Not on file  Tobacco Use  . Smoking status: Never Smoker  . Smokeless tobacco: Never Used  Vaping Use  . Vaping Use: Never used  Substance and Sexual Activity  . Alcohol use: Yes    Comment: per pt a beer once a week   . Drug use: Never  . Sexual activity: Not on file

## 2021-05-09 MED ORDER — ZOLPIDEM TARTRATE 5 MG PO TABS: 5.0000 mg | ORAL_TABLET | Freq: Every evening | ORAL | 0 refills | Status: DC | PRN

## 2021-05-09 NOTE — Telephone Encounter (Signed)
I sent in a refill.

## 2021-05-09 NOTE — Telephone Encounter (Signed)
Last OV: 04/07/2020 Last Refill: 08/16/2020 Disp: 20   R: 0  Future OV: None scheduled

## 2021-05-22 MED ORDER — ZOLPIDEM TARTRATE 5 MG PO TABS
5.0000 mg | ORAL_TABLET | Freq: Every evening | ORAL | 0 refills | Status: DC | PRN
Start: 2021-05-22 — End: 2022-06-19

## 2021-05-22 NOTE — Telephone Encounter (Signed)
So we will have to change the next refill to disp 15 ,I suppose. For next refill request

## 2021-05-30 ENCOUNTER — Other Ambulatory Visit: Payer: Self-pay

## 2021-05-30 ENCOUNTER — Encounter (HOSPITAL_BASED_OUTPATIENT_CLINIC_OR_DEPARTMENT_OTHER): Payer: Self-pay | Admitting: Physical Therapy

## 2021-05-30 ENCOUNTER — Ambulatory Visit (HOSPITAL_BASED_OUTPATIENT_CLINIC_OR_DEPARTMENT_OTHER): Payer: 59 | Attending: Orthopaedic Surgery | Admitting: Physical Therapy

## 2021-05-30 DIAGNOSIS — M25512 Pain in left shoulder: Secondary | ICD-10-CM | POA: Diagnosis present

## 2021-05-30 DIAGNOSIS — M25612 Stiffness of left shoulder, not elsewhere classified: Secondary | ICD-10-CM | POA: Diagnosis present

## 2021-05-30 DIAGNOSIS — G8929 Other chronic pain: Secondary | ICD-10-CM | POA: Insufficient documentation

## 2021-05-30 NOTE — Therapy (Signed)
[facility] [address] [place], [place], [address] Phone: [phone]   Fax:  [phone]  Physical Therapy Evaluation  Patient Details  Name: Christina Wilkins MRN: [id number] Date of Birth: 1956/06/02 Referring Provider (PT): Norlene Campbell, MD   Encounter Date: 05/30/2021   PT End of Session - 05/30/21 0938    Visit Number 1    Number of Visits 3    Date for PT Re-Evaluation 06/29/21    Authorization Type UHC    PT Start Time 0935    PT Stop Time 1014    PT Time Calculation (min) 39 min    Activity Tolerance Patient tolerated treatment well    Behavior During Therapy [facility] for tasks assessed/performed           Past Medical History:  Diagnosis Date  . Allergic rhinitis    seasonal summer cough takes claritin  . Varicella     Past Surgical History:  Procedure Laterality Date  . BUNIONECTOMY    . right eye scar tissue on cornea removed  April 2011    There were no vitals filed for this visit.    Subjective Assessment - 05/30/21 0939    Subjective It has been bothering me on and off for over a year. Got to where it was really huring me- cannot do CKC on UE with elbow extended, unable to clasp bra. Has started doing light weights. Not really pain, just tenderness.    Patient Stated Goals participate in workout classes, functional reach    Currently in Pain? No/denies              [facility] PT Assessment - 05/30/21 0001      Assessment   Medical Diagnosis adhesive capsulitis Left    Referring Provider (PT) Norlene Campbell, MD    Onset Date/Surgical Date --   about a year ago   Hand Dominance Right      Precautions   Precautions None      Restrictions   Weight Bearing Restrictions No      Balance Screen   Has the patient fallen in the past 6 months Yes    How many times? 2    Has the patient had a decrease in activity level because of a fear of falling?  No    Is the patient reluctant to leave  their home because of a fear of falling?  No      Home [employer] information centre manager residence      Prior Function   Level of Independence Independent    Vocation Full time employment    Actuary    Leisure hiking, biking, walking dog, swimming, gym classes      Cognition   Overall Cognitive Status Within Functional Limits for tasks assessed      Sensation   Additional Comments N/T bil hands      ROM / Strength   AROM / PROM / Strength AROM      AROM   AROM Assessment Site Shoulder    Right/Left Shoulder Right;Left    Right Shoulder Extension 60 Degrees    Right Shoulder Flexion 160 Degrees    Right Shoulder ABduction 164 Degrees    Right Shoulder Internal Rotation --   T6   Right Shoulder External Rotation --   T4   Left Shoulder Extension 50 Degrees    Left Shoulder Flexion 154 Degrees    Left Shoulder ABduction 150 Degrees  Left Shoulder Internal Rotation --   T10, end range pain   Left Shoulder External Rotation --   T 2     Palpation   Palpation comment LH biceps TTP on Lt                      Objective measurements completed on examination: See above findings.               PT Education - 05/30/21 1205    Education Details anatomy of condition, POC, HEP, exercise form/rationale    Person(s) Educated Patient    Methods Explanation;Demonstration;Tactile cues;Verbal cues;Handout    Comprehension Verbalized understanding;Returned demonstration;Verbal cues required;Tactile cues required;Need further instruction               PT Long Term Goals - 05/30/21 1200      PT LONG TERM GOAL #1   Title able to perform side plank in workout class on extended arm    Baseline pain at eval    Time 4   incr time due to work requirements   Period Weeks    Status New    Target Date 06/29/21      PT LONG TERM GOAL #2   Title will demo good scapular control with OKC and CKC exercises    Baseline will progress  as appropriate    Time 4    Period Weeks    Status New    Target Date 06/29/21      PT LONG TERM GOAL #3   Title independent with long term stretching program to supplement exercise classes    Baseline will progress as appropriate    Time 4    Period Weeks    Status New    Target Date 06/29/21                  Plan - 05/30/21 1156    Clinical Impression Statement Pt presents to PT with complaints of Lt shoulder pain and limited ROM  of indisious onset for about the last year. Overall ROM is good but limited vs the Right side. Demo lack of periscapular control in motions and required cues to avoid shoulder elevation. Pain consistent with LH biceps impingement- forgot to place ionto patch today, will trial at next visit if needed. Provided her with stretches to do throughout the day with adaptions to avoid impingement and advised that she continue with her exercises and avoid pain. Will plan to do 2 more follow ups for GHJ/periscapular training.    Personal Factors and Comorbidities Time since onset of injury/illness/exacerbation    Examination-Activity Limitations Bed Mobility;Sleep;Carry;Stand;Lift    Examination-Participation Restrictions Occupation;Cleaning    Stability/Clinical Decision Making Stable/Uncomplicated    Clinical Decision Making Low    Rehab Potential Good    PT Frequency 1x / week    PT Duration 3 weeks    PT Treatment/Interventions ADLs/Self Care Home Management;Cryotherapy;Electrical Stimulation;Iontophoresis 4mg /ml Dexamethasone;Ultrasound;Traction;Moist Heat;Therapeutic activities;Therapeutic exercise;Neuromuscular re-education;Patient/family education;Passive range of motion;Manual techniques;Dry needling;Taping    PT Next Visit Plan free weight training- periscap control, CKC strength/stability    PT Home Exercise Plan [id number]    Consulted and Agree with Plan of Care Patient           Patient will benefit from skilled therapeutic intervention in  order to improve the following deficits and impairments:  Decreased range of motion,Impaired UE functional use,Increased muscle spasms,Decreased activity tolerance,Pain,Improper body mechanics,Decreased mobility  Visit Diagnosis: Chronic left shoulder pain -  Plan: PT plan of care cert/re-cert  Stiffness of left shoulder, not elsewhere classified - Plan: PT plan of care cert/re-cert     Problem List Patient Active Problem List   Diagnosis Date Noted  . Pain in left shoulder 03/07/2021  . Pain in right ankle and joints of right foot 03/07/2021  . Chronic meniscal tear of knee 09/07/2020  . Arthritis of right knee 09/07/2020  . Subluxation of tendon of long head of biceps 02/25/2018  . Cyst of knee joint, right 02/25/2018  . Paresthesia of both hands 06/16/2015  . Menopausal and perimenopausal disorder 04/06/2013  . Medication management 04/06/2013  . H/O sleep disturbance 04/12/2012  . THYROIDITIS 09/14/2010  . MENOPAUSAL SYNDROME 09/14/2010  . THYROID STIMULATING HORMONE, ABNORMAL 08/29/2010  . ALLERGIC RHINITIS 08/29/2010    Kamren Heskett C. Merlene Dante PT, DPT 05/30/21 12:06 PM   [facility] [address] [place], [place], [address] Phone: [phone]   Fax:  [phone]  Name: Inell Mimbs MRN: [id number] Date of Birth: 1956/04/03

## 2021-06-21 ENCOUNTER — Encounter (HOSPITAL_BASED_OUTPATIENT_CLINIC_OR_DEPARTMENT_OTHER): Payer: Self-pay | Admitting: Physical Therapy

## 2021-06-27 ENCOUNTER — Encounter (HOSPITAL_BASED_OUTPATIENT_CLINIC_OR_DEPARTMENT_OTHER): Payer: Self-pay | Admitting: Physical Therapy

## 2021-06-27 ENCOUNTER — Other Ambulatory Visit: Payer: Self-pay

## 2021-06-27 ENCOUNTER — Ambulatory Visit (HOSPITAL_BASED_OUTPATIENT_CLINIC_OR_DEPARTMENT_OTHER): Payer: 59 | Attending: Orthopaedic Surgery | Admitting: Physical Therapy

## 2021-06-27 DIAGNOSIS — M25512 Pain in left shoulder: Secondary | ICD-10-CM | POA: Diagnosis present

## 2021-06-27 DIAGNOSIS — G8929 Other chronic pain: Secondary | ICD-10-CM | POA: Diagnosis present

## 2021-06-27 DIAGNOSIS — M25612 Stiffness of left shoulder, not elsewhere classified: Secondary | ICD-10-CM | POA: Insufficient documentation

## 2021-06-27 NOTE — Therapy (Signed)
[facility] [address] [place], [place], [address] Phone: [phone]   Fax:  [phone]  Physical Therapy TreatmentDischarge  Patient Details  Name: Christina Wilkins MRN: [id number] Date of Birth: 1956/03/24 Referring Provider (PT): Christina Fears, MD   Encounter Date: 06/27/2021   PT End of Session - 06/27/21 1305     Visit Number 2    Number of Visits 3    Date for PT Re-Evaluation 06/29/21    Authorization Type UHC    PT Start Time 1304    PT Stop Time 1330    PT Time Calculation (min) 26 min    Activity Tolerance Patient tolerated treatment well    Behavior During Therapy [facility] for tasks assessed/performed             Past Medical History:  Diagnosis Date   Allergic rhinitis    seasonal summer cough takes claritin   Varicella     Past Surgical History:  Procedure Laterality Date   BUNIONECTOMY     right eye scar tissue on cornea removed  April 2011    There were no vitals filed for this visit.   Subjective Assessment - 06/27/21 1933     Subjective I did not do my exercises as much as I should have but it feels good.    Currently in Pain? No/denies                [facility] PT Assessment - 06/27/21 0001       Assessment   Medical Diagnosis adhesive capsulitis Left    Referring Provider (PT) Christina Fears, MD      AROM   Overall AROM Comments equal Rt to Lt in bil motion                           [facility] PT Treatment/Exercise - 06/27/21 0001       Exercises   Exercises Shoulder;Other Exercises    Other Exercises  qped IYT, plank row, low trap set at wall, triceps kicks, D1, D2, standing long arc ABCs                    PT Education - 06/27/21 1933     Education Details using ROM in exercises    Person(s) Educated Patient    Methods Explanation;Demonstration;Tactile cues;Verbal cues;Handout    Comprehension Verbalized understanding;Returned  demonstration;Verbal cues required;Tactile cues required                 PT Long Term Goals - 06/27/21 1935       PT LONG TERM GOAL #1   Title able to perform side plank in workout class on extended arm    Status Unable to assess      PT LONG TERM GOAL #2   Title will demo good scapular control with OKC and CKC exercises    Status Achieved      PT LONG TERM GOAL #3   Title independent with long term stretching program to supplement exercise classes    Status Achieved                   Plan - 06/27/21 1934     Clinical Impression Statement Added exercises to retrain length tension relationships at end ranges of motion which pt did very well. Some cuing to remember to depress shoulder but pt was able to correct. She feels that she is prepared  to work on HEP independently and was encouraged to contact me with any further questions.    PT Treatment/Interventions ADLs/Self Care Home Management;Cryotherapy;Electrical Stimulation;Iontophoresis 74m/ml Dexamethasone;Ultrasound;Traction;Moist Heat;Therapeutic activities;Therapeutic exercise;Neuromuscular re-education;Patient/family education;Passive range of motion;Manual techniques;Dry needling;Taping    PT Home Exercise Plan [email or web address]    Consulted and Agree with Plan of Care Patient             Patient will benefit from skilled therapeutic intervention in order to improve the following deficits and impairments:  Decreased range of motion, Impaired UE functional use, Increased muscle spasms, Decreased activity tolerance, Pain, Improper body mechanics, Decreased mobility  Visit Diagnosis: Chronic left shoulder pain  Stiffness of left shoulder, not elsewhere classified     Problem List Patient Active Problem List   Diagnosis Date Noted   Pain in left shoulder 03/07/2021   Pain in right ankle and joints of right foot 03/07/2021   Chronic meniscal tear of knee 09/07/2020   Arthritis of right knee 09/07/2020    Subluxation of tendon of long head of biceps 02/25/2018   Cyst of knee joint, right 02/25/2018   Paresthesia of both hands 06/16/2015   Menopausal and perimenopausal disorder 04/06/2013   Medication management 04/06/2013   H/O sleep disturbance 04/12/2012   THYROIDITIS 09/14/2010   MENOPAUSAL SYNDROME 09/14/2010   THYROID STIMULATING HORMONE, ABNORMAL 08/29/2010   ALLERGIC RHINITIS 08/29/2010   PHYSICAL THERAPY DISCHARGE SUMMARY  Visits from Start of Care: 2  Current functional level related to goals / functional outcomes: See above   Remaining deficits: See above   Education / Equipment: Anatomy of condition, POC, hep, exercise form/[place]   Patient agrees to discharge. Patient goals were met. Patient is being discharged due to being pleased with the current functional level. Christina Wilkins C. Christina Wilkins PT, DPT 06/27/21 7:37 PM   [facility] [address] [address]Phone: [phone]  Fax:  [phone] Name: MGarielle MrozMRN: [id number] of Birth: [birth year]

## 2021-07-02 ENCOUNTER — Encounter (HOSPITAL_BASED_OUTPATIENT_CLINIC_OR_DEPARTMENT_OTHER): Payer: Self-pay | Admitting: Physical Therapy

## 2021-08-06 MED ORDER — PROMETHAZINE-CODEINE 6.25-10 MG/5ML PO SYRP: 5.0000 mL | ORAL_SOLUTION | Freq: Four times a day (QID) | ORAL | 0 refills | Status: DC | PRN

## 2021-08-06 NOTE — Telephone Encounter (Signed)
I refilled the cough medicine promethazine with codeine Hope you feel better soon You are right everyone who did not have COVID infection last year seems to be getting the omicron version this year.

## 2021-08-08 ENCOUNTER — Ambulatory Visit: Payer: 59 | Admitting: Rheumatology

## 2021-08-14 ENCOUNTER — Ambulatory Visit: Payer: 59 | Admitting: Rheumatology

## 2021-09-03 NOTE — Progress Notes (Deleted)
Office Visit Note  Patient: Christina Wilkins             Date of Birth: [birth year]           MRN: [id number]             PCP: Burnis Medin, MD Referring: Burnis Medin, MD Visit Date: 09/13/2021 Occupation: @GUAROCC @  Subjective:  No chief complaint on file.   History of Present Illness: Christina Wilkins is a 65 y.o. female ***   Activities of Daily Living:  Patient reports morning stiffness for *** {minute/hour:19697}.   Patient {ACTIONS;DENIES/REPORTS:21021675::"Denies"} nocturnal pain.  Difficulty dressing/grooming: {ACTIONS;DENIES/REPORTS:21021675::"Denies"} Difficulty climbing stairs: {ACTIONS;DENIES/REPORTS:21021675::"Denies"} Difficulty getting out of chair: {ACTIONS;DENIES/REPORTS:21021675::"Denies"} Difficulty using hands for taps, buttons, cutlery, and/or writing: {ACTIONS;DENIES/REPORTS:21021675::"Denies"}  No Rheumatology ROS completed.   PMFS History:  Patient Active Problem List   Diagnosis Date Noted   Pain in left shoulder 03/07/2021   Pain in right ankle and joints of right foot 03/07/2021   Chronic meniscal tear of knee 09/07/2020   Arthritis of right knee 09/07/2020   Subluxation of tendon of long head of biceps 02/25/2018   Cyst of knee joint, right 02/25/2018   Paresthesia of both hands 06/16/2015   Menopausal and perimenopausal disorder 04/06/2013   Medication management 04/06/2013   H/O sleep disturbance 04/12/2012   THYROIDITIS 09/14/2010   MENOPAUSAL SYNDROME 09/14/2010   THYROID STIMULATING HORMONE, ABNORMAL 08/29/2010   ALLERGIC RHINITIS 08/29/2010    Past Medical History:  Diagnosis Date   Allergic rhinitis    seasonal summer cough takes claritin   Varicella     Family History  Problem Relation Age of Onset   Breast cancer Mother 38   Hypothyroidism Mother    Arthritis Mother    Alcohol abuse Father    Hypertension Father    Allergies Sister    Alcohol abuse Brother    Past Surgical History:  Procedure  Laterality Date   BUNIONECTOMY     right eye scar tissue on cornea removed  April 2011   Social History   Social History Narrative   Single pharmacist   Regular Exercise- yes   hh of 3   Two adopted children    Immunization History  Administered Date(s) Administered   Influenza Split 10/18/2011, 09/30/2012   Influenza Whole 10/12/2007, 09/27/2008, 10/16/2009, 08/29/2010   Influenza,inj,Quad PF,6+ Mos 09/20/2013, 09/02/2014, 08/21/2015, 10/07/2016, 09/08/2017, 10/02/2018, 09/16/2019, 08/16/2020   PFIZER(Purple Top)SARS-COV-2 Vaccination 02/03/2020, 02/24/2020   PPD Test 03/24/2013   Td 12/23/2006   Tdap 02/19/2019   Zoster Recombinat (Shingrix) 01/22/2019, 05/07/2019     Objective: Vital Signs: There were no vitals taken for this visit.   Physical Exam   Musculoskeletal Exam: ***  CDAI Exam: CDAI Score: -- Patient Global: --; Provider Global: -- Swollen: --; Tender: -- Joint Exam 09/13/2021   No joint exam has been documented for this visit   There is currently no information documented on the homunculus. Go to the Rheumatology activity and complete the homunculus joint exam.  Investigation: No additional findings.  Imaging: No results found.  Recent Labs: Lab Results  Component Value Date   WBC 4.1 04/07/2020   HGB 14.3 04/07/2020   PLT 212.0 04/07/2020   NA 141 04/07/2020   K 4.3 04/07/2020   CL 104 04/07/2020   CO2 28 04/07/2020   GLUCOSE 63 (L) 04/07/2020   BUN 16 04/07/2020   CREATININE 0.88 04/07/2020   BILITOT 1.1 04/07/2020   ALKPHOS 78 04/07/2020   AST  15 04/07/2020   ALT 14 04/07/2020   PROT 6.5 04/07/2020   ALBUMIN 4.3 04/07/2020   CALCIUM 9.6 04/07/2020   04/07/20: ANA-, CCP<16, CRP<1, ESR 12, TSH 2.42, T4 0.80, Hep C ab-, iron panel WNL  Speciality Comments: No specialty comments available.  Procedures:  No procedures performed Allergies: Patient has no known allergies.   Assessment / Plan:     Visit Diagnoses: No diagnosis  found.  Orders: No orders of the defined types were placed in this encounter.  No orders of the defined types were placed in this encounter.   Face-to-face time spent with patient was *** minutes. Greater than 50% of time was spent in counseling and coordination of care.  Follow-Up Instructions: No follow-ups on file.   Bo Merino, MD  Note - This record has been created using Editor, commissioning.  Chart creation errors have been sought, but may not always  have been located. Such creation errors do not reflect on  the standard of medical care.

## 2021-09-13 ENCOUNTER — Ambulatory Visit: Payer: 59 | Admitting: Rheumatology

## 2021-09-13 DIAGNOSIS — Q667 Congenital pes cavus, unspecified foot: Secondary | ICD-10-CM

## 2021-09-13 DIAGNOSIS — M2241 Chondromalacia patellae, right knee: Secondary | ICD-10-CM

## 2021-09-13 DIAGNOSIS — M25861 Other specified joint disorders, right knee: Secondary | ICD-10-CM

## 2021-09-13 DIAGNOSIS — M19041 Primary osteoarthritis, right hand: Secondary | ICD-10-CM

## 2021-09-13 DIAGNOSIS — S46119A Strain of muscle, fascia and tendon of long head of biceps, unspecified arm, initial encounter: Secondary | ICD-10-CM

## 2021-11-21 ENCOUNTER — Other Ambulatory Visit: Payer: Self-pay | Admitting: Internal Medicine

## 2021-11-21 DIAGNOSIS — Z1231 Encounter for screening mammogram for malignant neoplasm of breast: Secondary | ICD-10-CM

## 2022-01-03 NOTE — Progress Notes (Signed)
Office Visit Note  Patient: Christina Wilkins             Date of Birth: March 11, 1956           MRN: [id number]             PCP: Burnis Medin, MD Referring: Burnis Medin, MD Visit Date: 01/16/2022 Occupation: _0 @  Subjective:  Pain in hands and feet.   History of Present Illness: Christina Wilkins is a 66 y.o. female with history of osteoarthritis.  She returns today after her last visit in July 2021.  She states she has been having intermittent increased pain in her left CMC joint.  She notices swelling in her left CMC joint.  She also has discomfort in her hands which she describes in her DIP joints.  She has been taking over-the-counter ibuprofen on as needed basis.  She complains of discomfort in her bilateral feet.  Activities of Daily Living:  Patient reports morning stiffness for 0 minutes.   Patient Reports nocturnal pain.  Difficulty dressing/grooming: Denies Difficulty climbing stairs: Denies Difficulty getting out of chair: Denies Difficulty using hands for taps, buttons, cutlery, and/or writing: Reports  Review of Systems  Constitutional:  Positive for fatigue.  HENT:  Negative for mouth sores, mouth dryness and nose dryness.   Eyes:  Negative for pain, itching and dryness.  Respiratory:  Negative for shortness of breath and difficulty breathing.   Cardiovascular:  Negative for chest pain and palpitations.  Gastrointestinal:  Negative for blood in stool, constipation and diarrhea.  Endocrine: Negative for increased urination.  Genitourinary:  Negative for difficulty urinating.  Musculoskeletal:  Positive for joint pain, joint pain and joint swelling. Negative for myalgias, morning stiffness, muscle tenderness and myalgias.  Skin:  Negative for color change, rash and redness.  Allergic/Immunologic: Negative for susceptible to infections.  Neurological:  Positive for numbness and weakness. Negative for dizziness, headaches and memory loss.   Hematological:  Positive for bruising/bleeding tendency.  Psychiatric/Behavioral:  Negative for confusion.    PMFS History:  Patient Active Problem List   Diagnosis Date Noted   Pain in left shoulder 03/07/2021   Pain in right ankle and joints of right foot 03/07/2021   Chronic meniscal tear of knee 09/07/2020   Arthritis of right knee 09/07/2020   Subluxation of tendon of long head of biceps 02/25/2018   Cyst of knee joint, right 02/25/2018   Paresthesia of both hands 06/16/2015   Menopausal and perimenopausal disorder 04/06/2013   Medication management 04/06/2013   H/O sleep disturbance 04/12/2012   THYROIDITIS 09/14/2010   MENOPAUSAL SYNDROME 09/14/2010   THYROID STIMULATING HORMONE, ABNORMAL 08/29/2010   ALLERGIC RHINITIS 08/29/2010    Past Medical History:  Diagnosis Date   Allergic rhinitis    seasonal summer cough takes claritin   Varicella     Family History  Problem Relation Age of Onset   Breast cancer Mother 72   Hypothyroidism Mother    Arthritis Mother    Alcohol abuse Father    Hypertension Father    Allergies Sister    Alcohol abuse Brother    Past Surgical History:  Procedure Laterality Date   BUNIONECTOMY     right eye scar tissue on cornea removed  April 2011   Social History   Social History Narrative   Single pharmacist   Regular Exercise- yes   hh of 3   Two adopted children    Immunization History  Administered Date(s) Administered  Influenza Split 10/18/2011, 09/30/2012   Influenza Whole 10/12/2007, 09/27/2008, 10/16/2009, 08/29/2010   Influenza,inj,Quad PF,6+ Mos 09/20/2013, 09/02/2014, 08/21/2015, 10/07/2016, 09/08/2017, 10/02/2018, 09/16/2019, 08/16/2020   Influenza,inj,Quad PF,6-35 Mos 08/21/2021   PFIZER(Purple Top)SARS-COV-2 Vaccination 02/03/2020, 02/24/2020   PPD Test 03/24/2013   Td 12/23/2006   Tdap 02/19/2019   Zoster Recombinat (Shingrix) 01/22/2019, 05/07/2019     Objective: Vital Signs: BP 134/79 (BP Location:  Left Arm, Patient Position: Sitting, Cuff Size: Small)    Pulse 64    Resp 13    Ht $R'5\' 7"'Cq$  (1.702 m)    Wt 152 lb 6.4 oz (69.1 kg)    BMI 23.87 kg/m    Physical Exam Vitals and nursing note reviewed.  Constitutional:      Appearance: She is well-developed.  HENT:     Head: Normocephalic and atraumatic.  Eyes:     Conjunctiva/sclera: Conjunctivae normal.  Cardiovascular:     Rate and Rhythm: Normal rate and regular rhythm.     Heart sounds: Normal heart sounds.  Pulmonary:     Effort: Pulmonary effort is normal.     Breath sounds: Normal breath sounds.  Abdominal:     General: Bowel sounds are normal.     Palpations: Abdomen is soft.  Musculoskeletal:     Cervical back: Normal range of motion.  Lymphadenopathy:     Cervical: No cervical adenopathy.  Skin:    General: Skin is warm and dry.     Capillary Refill: Capillary refill takes less than 2 seconds.  Neurological:     Mental Status: She is alert and oriented to person, place, and time.  Psychiatric:        Behavior: Behavior normal.     Musculoskeletal Exam: C-spine was in good range of motion without discomfort.  Shoulder joints, elbow joints, wrist joints with good range of motion.  She had bilateral CMC PIP and DIP thickening.  No synovitis was noted.  Hip joints and knee joints in good range of motion without any discomfort.  She had bilateral pes cavus with dorsal spurring.  Calluses were noted under MTPs and heels.  CDAI Exam: CDAI Score: -- Patient Global: --; Provider Global: -- Swollen: --; Tender: -- Joint Exam 01/16/2022   No joint exam has been documented for this visit   There is currently no information documented on the homunculus. Go to the Rheumatology activity and complete the homunculus joint exam.  Investigation: No additional findings.  Imaging: MM 3D SCREEN BREAST BILATERAL  Result Date: 01/08/2022 CLINICAL DATA:  Screening. EXAM: DIGITAL SCREENING BILATERAL MAMMOGRAM WITH TOMOSYNTHESIS AND  CAD TECHNIQUE: Bilateral screening digital craniocaudal and mediolateral oblique mammograms were obtained. Bilateral screening digital breast tomosynthesis was performed. The images were evaluated with computer-aided detection. COMPARISON:  Previous exam(s). ACR Breast Density Category b: There are scattered areas of fibroglandular density. FINDINGS: There are no findings suspicious for malignancy. IMPRESSION: No mammographic evidence of malignancy. A result letter of this screening mammogram Christina be mailed directly to the patient. RECOMMENDATION: Screening mammogram in one year. (Code:SM-B-01Y) BI-RADS CATEGORY  1: Negative. Electronically Signed   By: Abelardo Diesel M.D.   On: 01/08/2022 12:39    Recent Labs: Lab Results  Component Value Date   WBC 4.1 04/07/2020   HGB 14.3 04/07/2020   PLT 212.0 04/07/2020   NA 141 04/07/2020   K 4.3 04/07/2020   CL 104 04/07/2020   CO2 28 04/07/2020   GLUCOSE 63 (L) 04/07/2020   BUN 16 04/07/2020   CREATININE 0.88  04/07/2020   BILITOT 1.1 04/07/2020   ALKPHOS 78 04/07/2020   AST 15 04/07/2020   ALT 14 04/07/2020   PROT 6.5 04/07/2020   ALBUMIN 4.3 04/07/2020   CALCIUM 9.6 04/07/2020      Speciality Comments: No specialty comments available.  Procedures:  No procedures performed Allergies: Patient has no known allergies.   Assessment / Plan:     Visit Diagnoses: Multiple joint pain - History of pain in multiple joints.  April 07, 2020 ANA negative, anti-CCP negative, ESR normal.  She had no synovitis on examination.  Primary osteoarthritis of both hands - Clinical and radiographic findings from July 12, 2020 were consistent with osteoarthritis.  She continues to have PIP and DIP thickening and decreased range of motion in those joints.  She gives history of intermittent pain and swelling in her left CMC joint and discomfort.  She had no swelling on my examination today.  She has bilateral CMC arthritis.  A prescription for a left CMC brace was  given.  Use of topical Voltaren gel was also discussed.  Joint protection was discussed.  Chondromalacia patellae, right knee - History of chronic knee joint discomfort.  X-rays were consistent with chondromalacia patella.  She gave history of intermittent discomfort.  She uses natural anti-inflammatories.  Cyst of right knee joint - Patient stated that superficial cyst was drained by Dr. Tamala Julian x2 in the past  Pes cavus - Orthotics were advised.  I advised her to schedule an appointment with the podiatrist.  She had bilateral dorsal spurs and calluses under her metatarsals.  Primary osteoarthritis of both feet-stretching exercises were demonstrated and discussed.  Subluxation of tendon of long head of biceps - Not symptomatic.  Orders: No orders of the defined types were placed in this encounter.  No orders of the defined types were placed in this encounter.    Follow-Up Instructions: Return if symptoms worsen or fail to improve, for Osteoarthritis.   Bo Merino, MD  Note - This record has been created using Editor, commissioning.  Chart creation errors have been sought, but may not always  have been located. Such creation errors do not reflect on  the standard of medical care.

## 2022-01-08 ENCOUNTER — Ambulatory Visit
Admission: RE | Admit: 2022-01-08 | Discharge: 2022-01-08 | Disposition: A | Discharge status: Home / Self Care | Payer: 59 | Source: Ambulatory Visit | Attending: Internal Medicine | Admitting: Internal Medicine

## 2022-01-08 DIAGNOSIS — Z1231 Encounter for screening mammogram for malignant neoplasm of breast: Secondary | ICD-10-CM

## 2022-01-16 ENCOUNTER — Other Ambulatory Visit: Payer: Self-pay

## 2022-01-16 ENCOUNTER — Encounter: Payer: Self-pay | Admitting: Rheumatology

## 2022-01-16 ENCOUNTER — Ambulatory Visit: Payer: 59 | Admitting: Rheumatology

## 2022-01-16 VITALS — BP 134/79 | HR 64 | Resp 13 | Ht 67.0 in | Wt 152.4 lb

## 2022-01-16 DIAGNOSIS — M25861 Other specified joint disorders, right knee: Secondary | ICD-10-CM | POA: Diagnosis not present

## 2022-01-16 DIAGNOSIS — Q667 Congenital pes cavus, unspecified foot: Secondary | ICD-10-CM

## 2022-01-16 DIAGNOSIS — M19042 Primary osteoarthritis, left hand: Secondary | ICD-10-CM

## 2022-01-16 DIAGNOSIS — M2241 Chondromalacia patellae, right knee: Secondary | ICD-10-CM | POA: Diagnosis not present

## 2022-01-16 DIAGNOSIS — M2550 Pain in unspecified joint: Secondary | ICD-10-CM | POA: Diagnosis not present

## 2022-01-16 DIAGNOSIS — M19041 Primary osteoarthritis, right hand: Secondary | ICD-10-CM | POA: Diagnosis not present

## 2022-01-16 DIAGNOSIS — M19072 Primary osteoarthritis, left ankle and foot: Secondary | ICD-10-CM

## 2022-01-16 DIAGNOSIS — S43003A Unspecified subluxation of unspecified shoulder joint, initial encounter: Secondary | ICD-10-CM

## 2022-01-16 DIAGNOSIS — M19071 Primary osteoarthritis, right ankle and foot: Secondary | ICD-10-CM

## 2022-01-16 DIAGNOSIS — R202 Paresthesia of skin: Secondary | ICD-10-CM

## 2022-03-01 ENCOUNTER — Ambulatory Visit (INDEPENDENT_AMBULATORY_CARE_PROVIDER_SITE_OTHER): Payer: 59

## 2022-03-01 ENCOUNTER — Other Ambulatory Visit: Payer: Self-pay

## 2022-03-01 ENCOUNTER — Ambulatory Visit: Payer: 59 | Admitting: Podiatry

## 2022-03-01 DIAGNOSIS — M76811 Anterior tibial syndrome, right leg: Secondary | ICD-10-CM | POA: Diagnosis not present

## 2022-03-01 DIAGNOSIS — M779 Enthesopathy, unspecified: Secondary | ICD-10-CM

## 2022-03-01 MED ORDER — TRIAMCINOLONE ACETONIDE 10 MG/ML IJ SUSP
10.0000 mg | Freq: Once | INTRAMUSCULAR | Status: AC
  Administered 2022-03-01: 10 mg

## 2022-03-03 NOTE — Progress Notes (Signed)
Subjective:  ? ?Patient ID: Christina Wilkins, female   DOB: 66 y.o.   MRN: [id number]  ? ?HPI ?Patient presents stating she has some discomfort in the medial side of the right foot which has been present for several months and does not remember injury.  States it can throb at nighttime and does not seem to inhibit her exercise.  Patient is active does not smoke ? ? ?Review of Systems  ?All other systems reviewed and are negative. ? ? ?   ?Objective:  ?Physical Exam ?Vitals and nursing note reviewed.  ?Constitutional:   ?   Appearance: She is well-developed.  ?Pulmonary:  ?   Effort: Pulmonary effort is normal.  ?Musculoskeletal:     ?   General: Normal range of motion.  ?Skin: ?   General: Skin is warm.  ?Neurological:  ?   Mental Status: She is alert.  ?  ?Neurovascular status was found to be intact muscle strength is found to be adequate range of motion adequate.  Patient does have no loss of strength posterior tibial or anterior tibial muscle right and I did note inflammation on the medial side around the anterior tib but slightly more proximal to this spot.  There is discomfort associated with this area and patient has good digital perfusion well oriented ? ?   ?Assessment:  ?Possibility that this may be some form of anterior tibial tendinitis or slightly proximal within the ligament complex ? ?   ?Plan:  ?H&P x-ray reviewed.  I discussed careful injection treatment I did discuss risk of rupture with this and that I want her to be careful with it especially the next few days and utilize ice and supportive shoes.  I did sterile prep I injected carefully just proximal to the anterior tibial insertion right 3 mg dexamethasone Kenalog 5 mg Xylocaine into the sheath advised on reduced activity and reappoint as symptoms indicate ? ?X-rays do not indicate any signs of bone pathology associated with the inflammation pain she is experiencing ?   ? ? ?

## 2022-03-15 ENCOUNTER — Other Ambulatory Visit: Payer: Self-pay

## 2022-03-15 ENCOUNTER — Ambulatory Visit (INDEPENDENT_AMBULATORY_CARE_PROVIDER_SITE_OTHER): Payer: 59

## 2022-03-15 ENCOUNTER — Ambulatory Visit: Payer: 59 | Admitting: Family Medicine

## 2022-03-15 ENCOUNTER — Encounter: Payer: Self-pay | Admitting: Family Medicine

## 2022-03-15 VITALS — BP 130/78 | HR 81 | Temp 98.0°F | Ht 67.0 in | Wt 148.2 lb

## 2022-03-15 DIAGNOSIS — R059 Cough, unspecified: Secondary | ICD-10-CM

## 2022-03-15 MED ORDER — HYDROCOD POLI-CHLORPHE POLI ER 10-8 MG/5ML PO SUER: 5.0000 mL | Freq: Two times a day (BID) | ORAL | 0 refills | Status: AC | PRN

## 2022-03-15 NOTE — Progress Notes (Signed)
? ?Established Patient Office Visit ? ?Subjective:  ?Patient ID: Christina Wilkins, female    DOB: 1956-05-28  Age: 66 y.o. MRN: [id number] ? ?CC:  ?Chief Complaint  ?Patient presents with  ? Cough  ?  Patient complains of cough, x1 month  ? ? ?HPI ?Christina Wilkins presents for approximate 1 month history of cough.  She just returned from trip to [place] in February.  When she got back she had some chills and possible low-grade fever.  Until few days ago her cough seem to be dry and severe at times but more "loose ".  Not aware of any obvious wheezing.  Denies any significant postnasal drip or chronic sinusitis symptoms.  No dyspnea.  No GERD symptoms.  COVID test negative.  Cough has been severe at night interfering frequently with sleep.  She has no history of asthma.  No chronic lung disease.  Non-smoker. ? ?Past Medical History:  ?Diagnosis Date  ? Allergic rhinitis   ? seasonal summer cough takes claritin  ? Varicella   ? ? ?Past Surgical History:  ?Procedure Laterality Date  ? BUNIONECTOMY    ? right eye scar tissue on cornea removed  April 2011  ? ? ?Family History  ?Problem Relation Age of Onset  ? Breast cancer Mother 50  ? Hypothyroidism Mother   ? Arthritis Mother   ? Alcohol abuse Father   ? Hypertension Father   ? Allergies Sister   ? Alcohol abuse Brother   ? ? ?Social History  ? ?Socioeconomic History  ? Marital status: Single  ?  Spouse name: Not on file  ? Number of children: Not on file  ? Years of education: Not on file  ? Highest education level: Not on file  ?Occupational History  ? Not on file  ?Tobacco Use  ? Smoking status: Never  ? Smokeless tobacco: Never  ?Vaping Use  ? Vaping Use: Never used  ?Substance and Sexual Activity  ? Alcohol use: Yes  ?  Comment: per pt a beer once a week   ? Drug use: Never  ? Sexual activity: Not on file  ?Other Topics Concern  ? Not on file  ?Social History Narrative  ? Single pharmacist  ? Regular Exercise- yes  ? hh of 3  ? Two adopted  children   ? ?Social Determinants of Health  ? ?Financial Resource Strain: Not on file  ?Food Insecurity: Not on file  ?Transportation Needs: Not on file  ?Physical Activity: Not on file  ?Stress: Not on file  ?Social Connections: Not on file  ?Intimate Partner Violence: Not on file  ? ? ?Outpatient Medications Prior to Visit  ?Medication Sig Dispense Refill  ? diclofenac Sodium (VOLTAREN) 1 % GEL Apply topically 4 (four) times daily.    ? fluorouracil (EFUDEX) 5 % cream as needed.    ? Ginger, Zingiber officinalis, (GINGER PO) Take by mouth.    ? Glucosamine-Chondroitin 750-600 MG TABS Take 1 tablet by mouth daily.    ? loratadine (CLARITIN) 10 MG tablet Take 10 mg by mouth daily.    ? Misc Natural Products (TART CHERRY ADVANCED PO) Take by mouth.    ? Multiple Vitamin (MULTIVITAMIN) tablet Take 1 tablet by mouth daily.    ? naproxen sodium (ALEVE) 220 MG tablet Take 220 mg by mouth 2 (two) times daily.    ? promethazine-codeine (PHENERGAN WITH CODEINE) 6.25-10 MG/5ML syrup Take 5 mLs by mouth every 6 (six) hours as needed for cough.  180 mL 0  ? tretinoin (RETIN-A) 0.025 % cream     ? TURMERIC PO Take 1 capsule by mouth daily.    ? zolpidem (AMBIEN) 5 MG tablet Take 1 tablet (5 mg total) by mouth at bedtime as needed. for sleep travel 15 tablet 0  ? ?No facility-administered medications prior to visit.  ? ? ?No Known Allergies ? ?ROS ?Review of Systems  ?Constitutional:  Negative for chills and fever.  ?HENT:  Negative for sinus pressure and sinus pain.   ?Respiratory:  Positive for cough. Negative for shortness of breath and wheezing.   ?Cardiovascular:  Negative for chest pain.  ? ?  ?Objective:  ?  ?Physical Exam ?Vitals reviewed.  ?Constitutional:   ?   Appearance: Normal appearance.  ?Cardiovascular:  ?   Rate and Rhythm: Normal rate and regular rhythm.  ?Pulmonary:  ?   Effort: Pulmonary effort is normal. No respiratory distress.  ?   Breath sounds: Normal breath sounds. No wheezing.  ?   Comments: Few faint  crackles right base which clears slightly with deep breathing ?Neurological:  ?   Mental Status: She is alert.  ? ? ?BP 130/78 (BP Location: Left Arm, Patient Position: Sitting, Cuff Size: Normal)   Pulse 81   Temp 98 ?F (36.7 ?C) (Oral)   Ht 5\' 7"  (1.702 m)   Wt 148 lb 3.2 oz (67.2 kg)   SpO2 97%   BMI 23.21 kg/m?  ?Wt Readings from Last 3 Encounters:  ?03/15/22 148 lb 3.2 oz (67.2 kg)  ?01/16/22 152 lb 6.4 oz (69.1 kg)  ?04/04/21 145 lb (65.8 kg)  ? ? ? ?Health Maintenance Due  ?Topic Date Due  ? HIV Screening  Never done  ? COLONOSCOPY (Pts 45-79yrs Insurance coverage will need to be confirmed)  12/30/2018  ? COVID-19 Vaccine (3 - Pfizer risk series) 03/23/2020  ? PAP SMEAR-Modifier  01/22/2022  ? ? ?There are no preventive care reminders to display for this patient. ? ?Lab Results  ?Component Value Date  ? TSH 2.42 04/07/2020  ? ?Lab Results  ?Component Value Date  ? WBC 4.1 04/07/2020  ? HGB 14.3 04/07/2020  ? HCT 41.9 04/07/2020  ? MCV 88.1 04/07/2020  ? PLT 212.0 04/07/2020  ? ?Lab Results  ?Component Value Date  ? NA 141 04/07/2020  ? K 4.3 04/07/2020  ? CO2 28 04/07/2020  ? GLUCOSE 63 (L) 04/07/2020  ? BUN 16 04/07/2020  ? CREATININE 0.88 04/07/2020  ? BILITOT 1.1 04/07/2020  ? ALKPHOS 78 04/07/2020  ? AST 15 04/07/2020  ? ALT 14 04/07/2020  ? PROT 6.5 04/07/2020  ? ALBUMIN 4.3 04/07/2020  ? CALCIUM 9.6 04/07/2020  ? GFR 65.15 04/07/2020  ? ?Lab Results  ?Component Value Date  ? CHOL 215 (H) 01/22/2019  ? ?Lab Results  ?Component Value Date  ? HDL 86.10 01/22/2019  ? ?Lab Results  ?Component Value Date  ? [place] 111 (H) 01/22/2019  ? ?Lab Results  ?Component Value Date  ? TRIG 90.0 01/22/2019  ? ?Lab Results  ?Component Value Date  ? CHOLHDL 3 01/22/2019  ? ?Lab Results  ?Component Value Date  ? HGBA1C 5.3 06/16/2015  ? ? ?  ?Assessment & Plan:  ? ?Problem List Items Addressed This Visit   ?None ?Visit Diagnoses   ? ? Cough, unspecified type    -  Primary  ? Relevant Orders  ? DG Chest 2 View  ? ?   ?1 month history of cough in a  non-smoker.  Recent trip to [place].  Nonfocal exam.  Patient in no respiratory distress. ? ?-Given duration of cough will obtain chest x-ray ?-Tussionex 1 teaspoon nightly for severe cough ?-Follow-up promptly for any fever or for any persistent or worsening symptoms ? ?Meds ordered this encounter  ?Medications  ? chlorpheniramine-HYDROcodone (TUSSIONEX PENNKINETIC ER) 10-8 MG/5ML  ?  Sig: Take 5 mLs by mouth every 12 (twelve) hours as needed for cough.  ?  Dispense:  115 mL  ?  Refill:  0  ? ? ?Follow-up: No follow-ups on file.  ? ? ?Carolann Littler, MD ?

## 2022-03-17 ENCOUNTER — Encounter: Payer: Self-pay | Admitting: Family Medicine

## 2022-03-18 ENCOUNTER — Ambulatory Visit: Payer: 59 | Admitting: Internal Medicine

## 2022-03-18 MED ORDER — PREDNISONE 20 MG PO TABS: ORAL_TABLET | ORAL | 0 refills | Status: DC

## 2022-03-18 NOTE — Telephone Encounter (Signed)
Forwarding to  treating provider for  best response

## 2022-03-18 NOTE — Telephone Encounter (Signed)
I spoke with the pt and she reported her chest still feels "rattly" and has ongoing cough/congestion.  ?

## 2022-03-18 NOTE — Telephone Encounter (Signed)
Spoke with patient.  No fever.  No sinusitis symptoms in terms of any facial pain.  No purulent secretions.  We elected to go ahead and send in prednisone 20 mg 2 tablets daily for 5 days and consider over-the-counter Mucinex 1200 L twice daily.  Touch base if not improving in 1 week ?

## 2022-04-14 IMAGING — MG MM DIGITAL SCREENING BILAT W/ TOMO AND CAD
8 series · 9 of 24 positions shown · non-contrast
Comparison: Previous exam(s).

CLINICAL DATA: Screening.

EXAM:
DIGITAL SCREENING BILATERAL MAMMOGRAM WITH TOMOSYNTHESIS AND CAD
TECHNIQUE: Bilateral screening digital craniocaudal and mediolateral oblique
mammograms were obtained. Bilateral screening digital breast
tomosynthesis was performed. The images were evaluated with
computer-aided detection.

[R CC synth-2D]
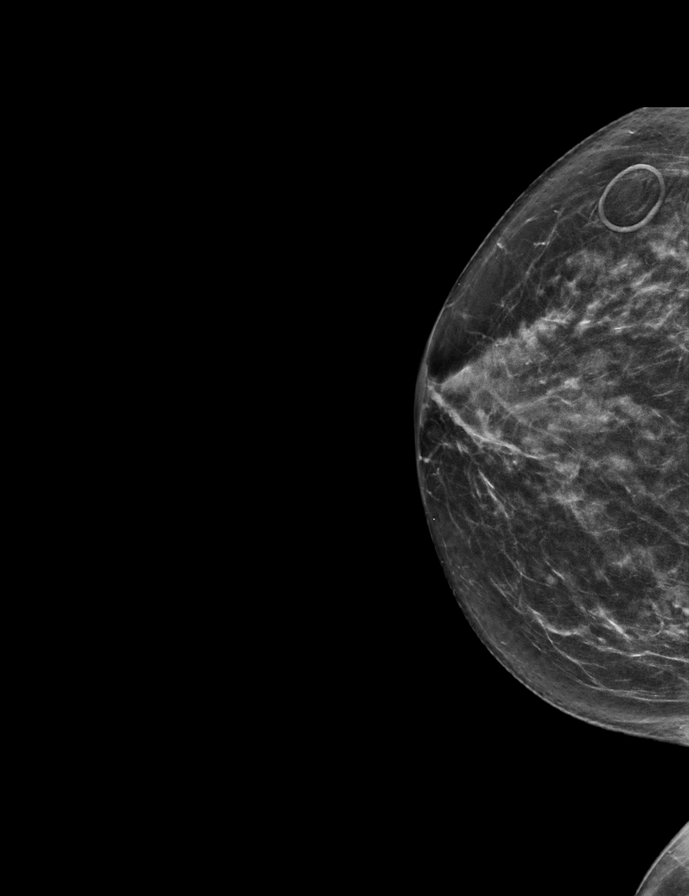

[R MLO synth-2D]
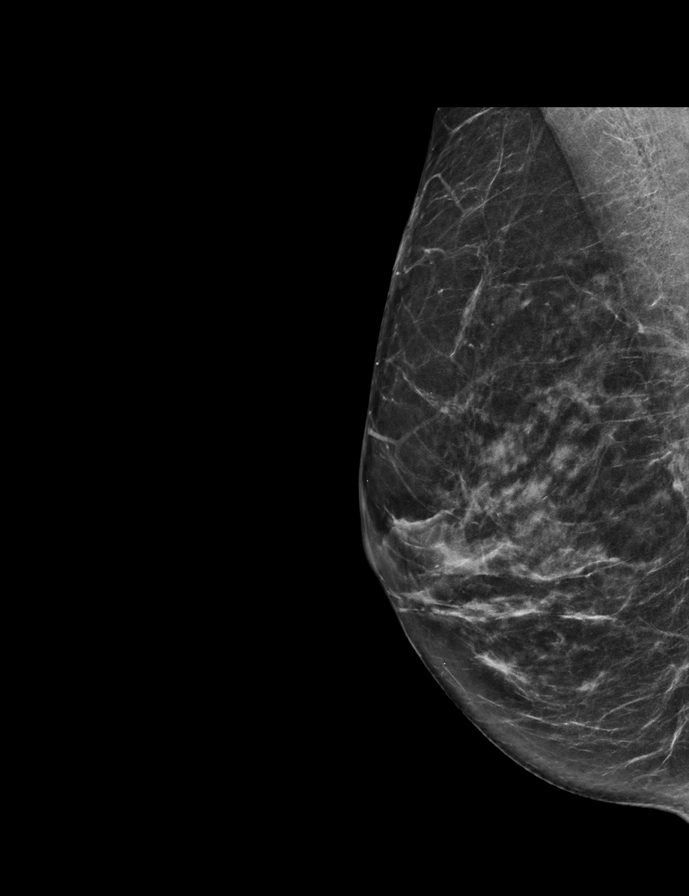

[L CC synth-2D]
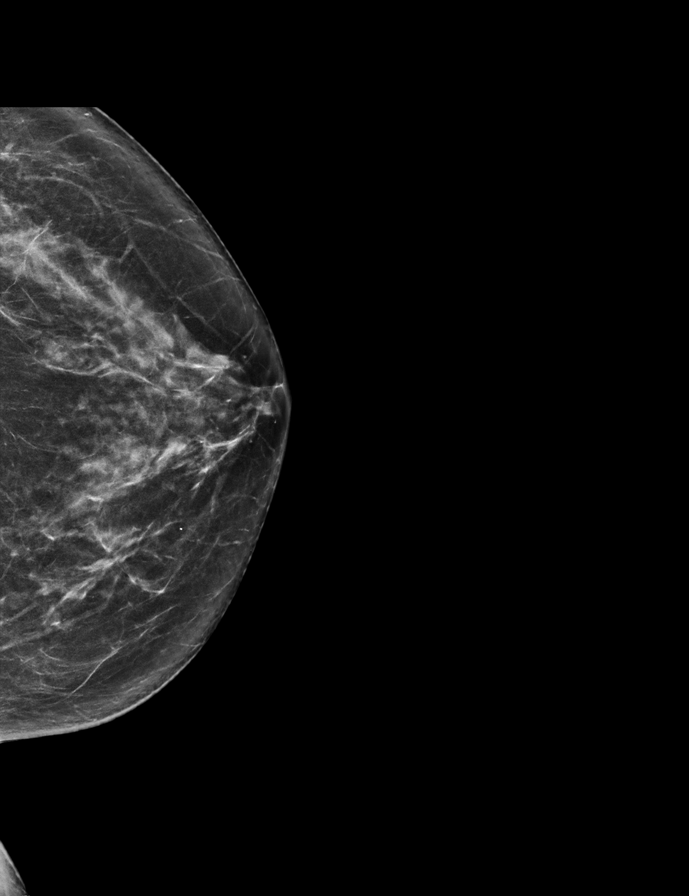

[L MLO synth-2D]
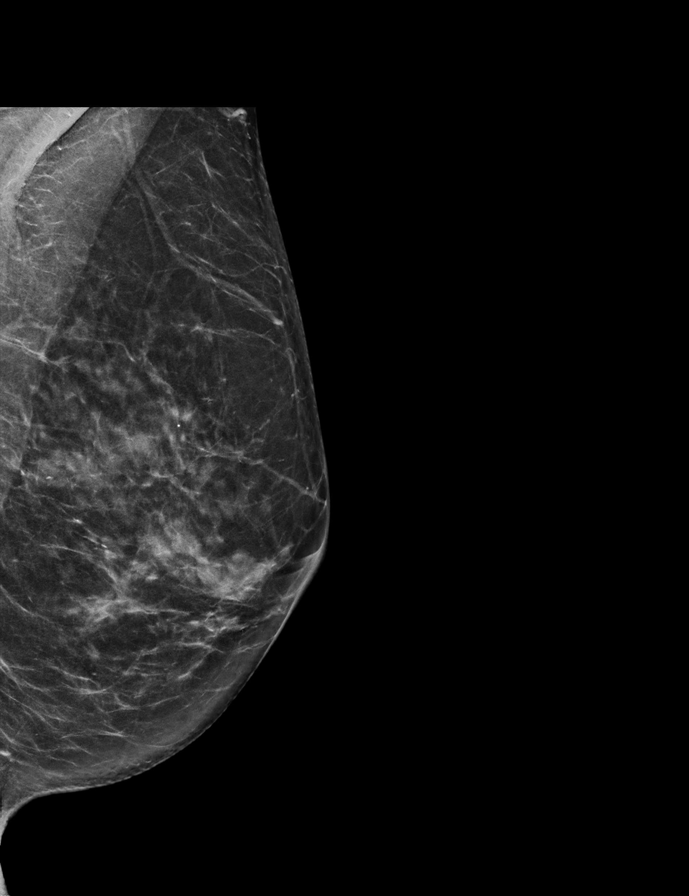

[L MLO tomo · 2 of 64 frames shown]
[frame 21/64]
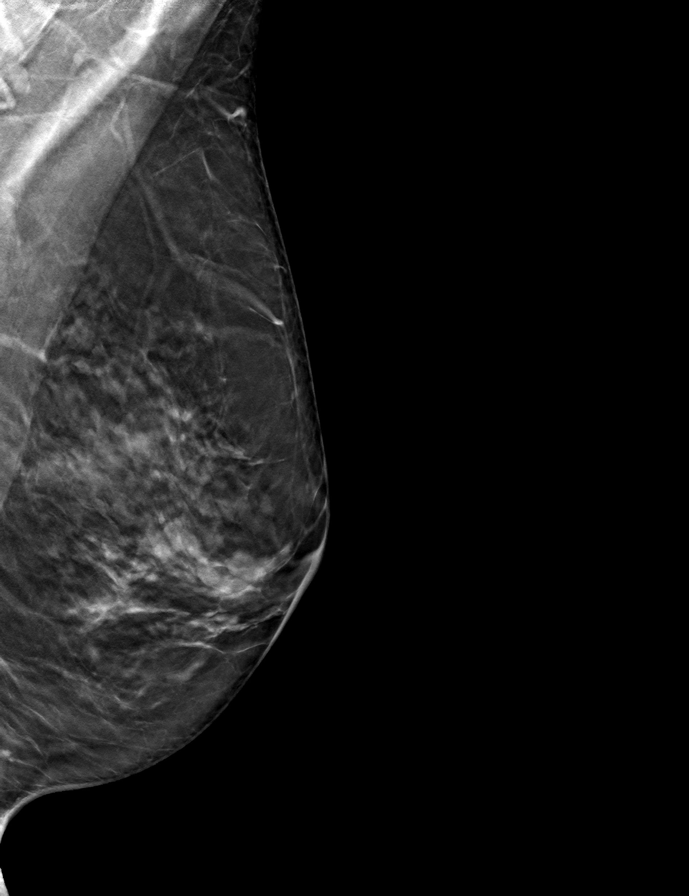
[frame 33/64]
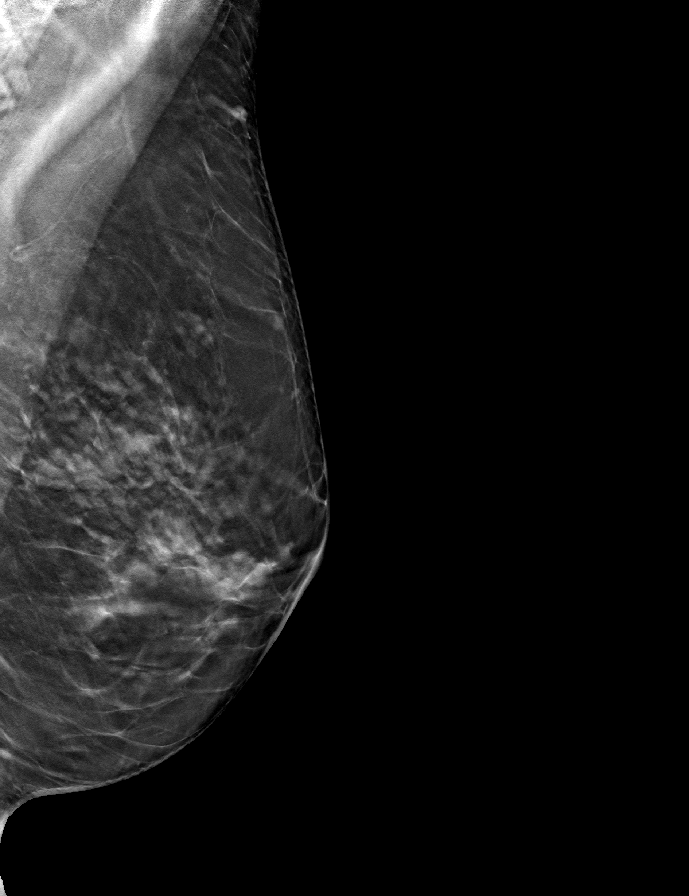

[L CC tomo · tomo slice 33/64.0]
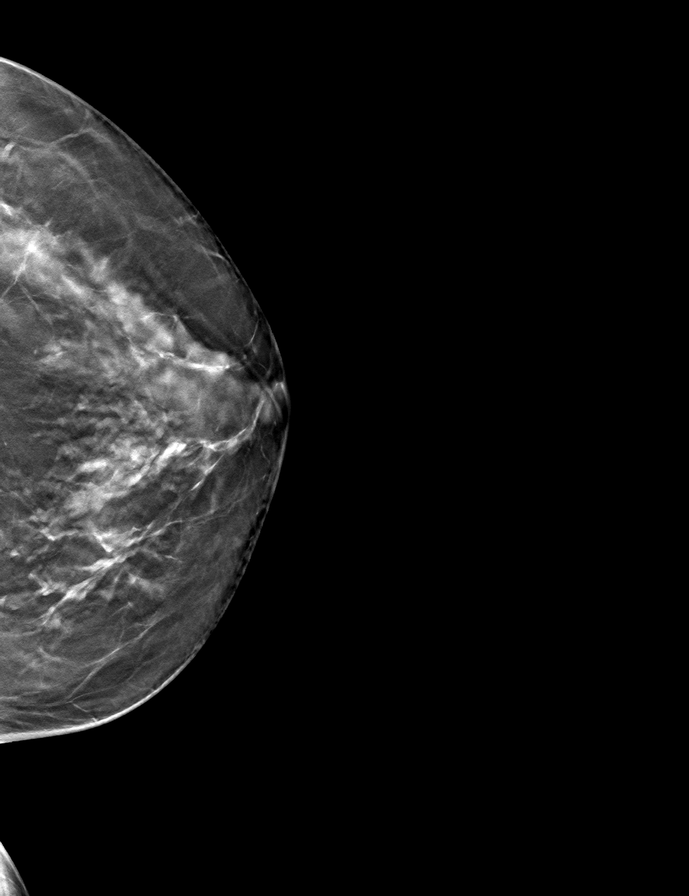

[R CC tomo · tomo slice 29/58.0]
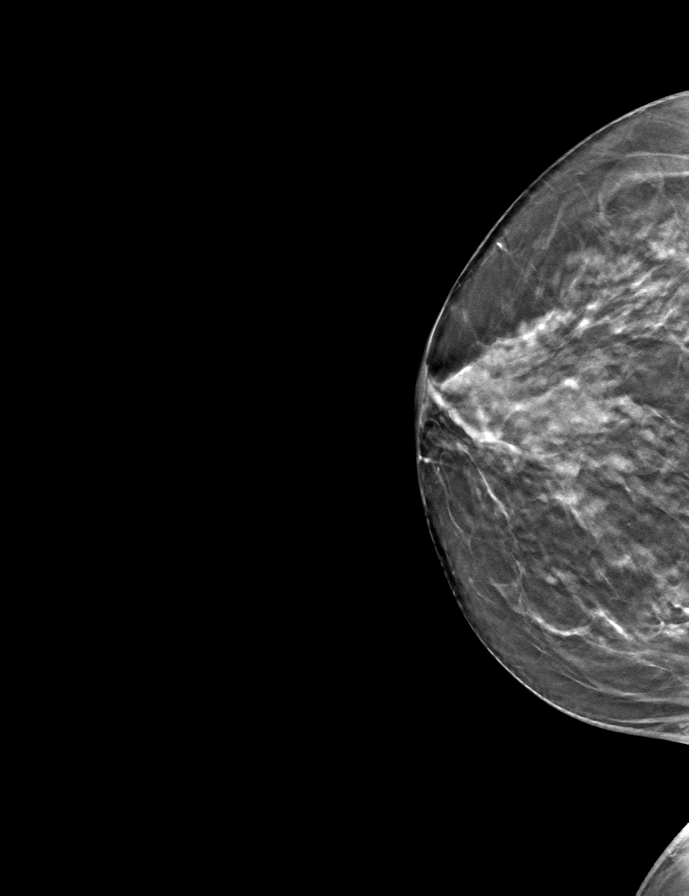

[R MLO tomo · tomo slice 31/62.0]
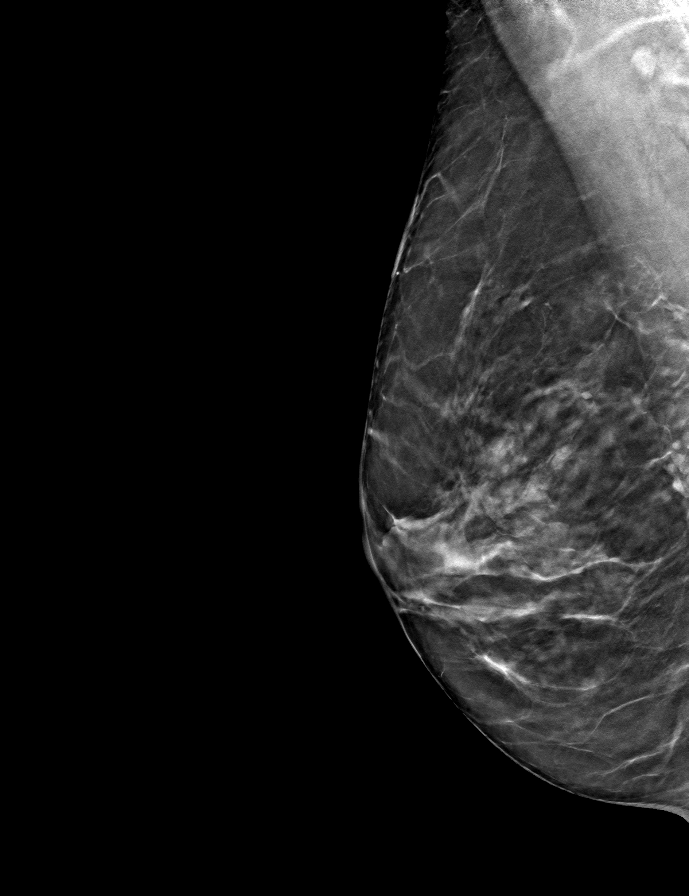

[9 of 24 positions shown; findings below may reference images not displayed]

ACR Breast Density Category b: There are scattered areas of
fibroglandular density.
FINDINGS: There are no findings suspicious for malignancy.
IMPRESSION: No mammographic evidence of malignancy. A result letter of this
screening mammogram will be mailed directly to the patient.

RECOMMENDATION:
Screening mammogram in one year. (Code:51-O-LD2)

BI-RADS CATEGORY  1: Negative.

## 2022-06-19 ENCOUNTER — Encounter: Payer: Self-pay | Admitting: Internal Medicine

## 2022-06-19 ENCOUNTER — Ambulatory Visit (INDEPENDENT_AMBULATORY_CARE_PROVIDER_SITE_OTHER): Payer: 59 | Admitting: Internal Medicine

## 2022-06-19 VITALS — BP 106/74 | HR 77 | Temp 97.9°F | Ht 67.0 in | Wt 143.2 lb

## 2022-06-19 DIAGNOSIS — Z0000 Encounter for general adult medical examination without abnormal findings: Secondary | ICD-10-CM | POA: Diagnosis not present

## 2022-06-19 DIAGNOSIS — E069 Thyroiditis, unspecified: Secondary | ICD-10-CM | POA: Diagnosis not present

## 2022-06-19 DIAGNOSIS — G479 Sleep disorder, unspecified: Secondary | ICD-10-CM

## 2022-06-19 DIAGNOSIS — Z1211 Encounter for screening for malignant neoplasm of colon: Secondary | ICD-10-CM

## 2022-06-19 DIAGNOSIS — Z79899 Other long term (current) drug therapy: Secondary | ICD-10-CM

## 2022-06-19 MED ORDER — ZOLPIDEM TARTRATE 5 MG PO TABS: 5.0000 mg | ORAL_TABLET | Freq: Every evening | ORAL | 2 refills | Status: DC | PRN

## 2022-06-19 NOTE — Progress Notes (Signed)
Chief Complaint  Patient presents with   Annual Exam    Not fasting     HPI: Patient  Christina Wilkins  66 y.o. comes in today for Preventive Health Care visit  Feels well since retirement In rsv    study .      Vaccine study . Probably  got placebo Metapneumonia virus .  After travel to [place] .   Earlier in year  was a rought resp infection but ok now  Is hiking club  will walk 11 miles  [place] helps sleep   not using as much since retirement .  Ocass benadryl.   Sleep interrupted  not as much ambien. Health Maintenance  Topic Date Due   HIV Screening  Never done   PAP SMEAR-Modifier  12/19/2022 (Originally 01/22/2022)   COLONOSCOPY (Pts 45-49yrs Insurance coverage will need to be confirmed)  12/19/2022 (Originally 12/30/2018)   COVID-19 Vaccine (3 - Pfizer risk series) 12/19/2022 (Originally 03/23/2020)   INFLUENZA VACCINE  07/23/2022   MAMMOGRAM  01/09/2024   TETANUS/TDAP  02/19/2029   Hepatitis C Screening  Completed   Zoster Vaccines- Shingrix  Completed   HPV VACCINES  Aged Out   Health Maintenance Review LIFESTYLE:  Exercise:  very physically active hiking swimming other  no limitations  Tobacco/ETS:n Alcohol: few a week Sugar beverages:n Sleep:better off an on Drug use: no HH of 1=rachel coming back home Work:retired April 23  Wt Readings from Last 3 Encounters:  06/19/22 143 lb 3.2 oz (65 kg)  03/15/22 148 lb 3.2 oz (67.2 kg)  01/16/22 152 lb 6.4 oz (69.1 kg)   ROS:     REST of 12 system review negative except as per HPI nerve pain in foot  r  not progressive  some arthritis  thumbs    Past Medical History:  Diagnosis Date   Allergic rhinitis    seasonal summer cough takes claritin   Varicella     Past Surgical History:  Procedure Laterality Date   BUNIONECTOMY     right eye scar tissue on cornea removed  April 2011    Family History  Problem Relation Age of Onset   Breast cancer Mother 49   Hypothyroidism Mother    Arthritis  Mother    Alcohol abuse Father    Hypertension Father    Allergies Sister    Alcohol abuse Brother     Social History   Socioeconomic History   Marital status: Single    Spouse name: Not on file   Number of children: Not on file   Years of education: Not on file   Highest education level: Not on file  Occupational History   Not on file  Tobacco Use   Smoking status: Never   Smokeless tobacco: Never  Vaping Use   Vaping Use: Never used  Substance and Sexual Activity   Alcohol use: Yes    Comment: per pt a beer once a week    Drug use: Never   Sexual activity: Not on file  Other Topics Concern   Not on file  Social History Narrative   Single pharmacist   Regular Exercise- yes   hh of 3   Two adopted children    Social Determinants of Health   Financial Resource Strain: Not on file  Food Insecurity: Not on file  Transportation Needs: Not on file  Physical Activity: Not on file  Stress: Not on file  Social Connections: Not on file  Outpatient Medications Prior to Visit  Medication Sig Dispense Refill   diclofenac Sodium (VOLTAREN) 1 % GEL Apply topically 4 (four) times daily.     fluorouracil (EFUDEX) 5 % cream as needed.     Ginger, Zingiber officinalis, (GINGER PO) Take by mouth.     Glucosamine-Chondroitin 750-600 MG TABS Take 1 tablet by mouth daily.     loratadine (CLARITIN) 10 MG tablet Take 10 mg by mouth daily.     Misc Natural Products (TART CHERRY ADVANCED PO) Take by mouth.     Multiple Vitamin (MULTIVITAMIN) tablet Take 1 tablet by mouth daily.     predniSONE (DELTASONE) 20 MG tablet Take two tablets by mouth once daily for 5 days. 10 tablet 0   promethazine-codeine (PHENERGAN WITH CODEINE) 6.25-10 MG/5ML syrup Take 5 mLs by mouth every 6 (six) hours as needed for cough. 180 mL 0   tretinoin (RETIN-A) 0.025 % cream      TURMERIC PO Take 1 capsule by mouth daily.     zolpidem (AMBIEN) 5 MG tablet Take 1 tablet (5 mg total) by mouth at bedtime as  needed. for sleep travel 15 tablet 0   chlorpheniramine-HYDROcodone (TUSSIONEX PENNKINETIC ER) 10-8 MG/5ML Take 5 mLs by mouth every 12 (twelve) hours as needed for cough. (Patient not taking: Reported on 06/19/2022) 115 mL 0   naproxen sodium (ALEVE) 220 MG tablet Take 220 mg by mouth 2 (two) times daily. (Patient not taking: Reported on 06/19/2022)     No facility-administered medications prior to visit.     EXAM:  BP 106/74 (BP Location: Left Arm, Patient Position: Sitting, Cuff Size: Normal)   Pulse 77   Temp 97.9 F (36.6 C) (Oral)   Ht 5\' 7"  (1.702 m)   Wt 143 lb 3.2 oz (65 kg)   SpO2 99%   BMI 22.43 kg/m   Body mass index is 22.43 kg/m. Wt Readings from Last 3 Encounters:  06/19/22 143 lb 3.2 oz (65 kg)  03/15/22 148 lb 3.2 oz (67.2 kg)  01/16/22 152 lb 6.4 oz (69.1 kg)    Physical Exam: Vital signs reviewed 01/18/22 is a well-developed well-nourished alert cooperative    who appearsr stated age in no acute distress.  HEENT: normocephalic atraumatic , Eyes: PERRL EOM's full, conjunctiva clear, Nares: paten,t no deformity discharge or tenderness., Ears: no deformity EAC's clear TMs with normal landmarks.  NECK: supple without masses, thyromegaly or bruits. CHEST/PULM:  Clear to auscultation and percussion breath sounds equal no wheeze , rales or rhonchi. No chest wall deformities or tenderness. Breast: normal by inspection . No dimpling, discharge, masses, tenderness or discharge . CV: PMI is nondisplaced, S1 S2 no gallops, murmurs, rubs. Peripheral pulses are full without delay.  ABDOMEN: Bowel sounds normal nontender  No guard or rebound, no hepato splenomegal no CVA tenderness.   Extremtities:  No clubbing cyanosis or edema, no acute joint swelling or redness no focal atrophy NEURO:  Oriented x3, cranial nerves 3-12 appear to be intact, no obvious focal weakness,gait within normal limits no abnormal reflexes or asymmetrical SKIN: No acute rashes normal turgor, color, no  bruising or petechiae. Sun changes  PSYCH: Oriented, good eye contact, no obvious depression anxiety, cognition and judgment appear normal. LN: no cervical axillary  adenopathy  Lab Results  Component Value Date   WBC 4.1 04/07/2020   HGB 14.3 04/07/2020   HCT 41.9 04/07/2020   PLT 212.0 04/07/2020   GLUCOSE 63 (L) 04/07/2020   CHOL 215 (H) 01/22/2019  TRIG 90.0 01/22/2019   HDL 86.10 01/22/2019   LDLCALC 111 (H) 01/22/2019   ALT 14 04/07/2020   AST 15 04/07/2020   NA 141 04/07/2020   K 4.3 04/07/2020   CL 104 04/07/2020   CREATININE 0.88 04/07/2020   BUN 16 04/07/2020   CO2 28 04/07/2020   TSH 2.42 04/07/2020   HGBA1C 5.3 06/16/2015    BP Readings from Last 3 Encounters:  06/19/22 106/74  03/15/22 130/78  01/16/22 134/79    Lab plan reviewed with patient  nf   ASSESSMENT AND PLAN:  Discussed the following assessment and plan:    ICD-10-CM   1. Visit for preventive health examination  Z00.00 Basic metabolic panel    CBC with Differential/Platelet    TSH    T4, free    Lipid panel    Hepatic function panel    Hepatic function panel    Lipid panel    T4, free    TSH    CBC with Differential/Platelet    Basic metabolic panel   last pap 1 2020 neg hpv hx of neg in past  no exposure or sx     2. Medication management  Z79.899 Basic metabolic panel    CBC with Differential/Platelet    TSH    T4, free    Lipid panel    Hepatic function panel    Hepatic function panel    Lipid panel    T4, free    TSH    CBC with Differential/Platelet    Basic metabolic panel    3. THYROIDITIS hx of   E06.9 Basic metabolic panel    CBC with Differential/Platelet    TSH    T4, free    Lipid panel    Hepatic function panel    Hepatic function panel    Lipid panel    T4, free    TSH    CBC with Differential/Platelet    Basic metabolic panel   abd tsh in past but nl  in recnet past     4. Colon cancer screening  Z12.11 Basic metabolic panel    CBC with  Differential/Platelet    TSH    T4, free    Lipid panel    Hepatic function panel    Cologuard    Hepatic function panel    Lipid panel    T4, free    TSH    CBC with Differential/Platelet    Basic metabolic panel    5. Sleep difficulties  G47.9    better intermittent   rx ambien for travela nd when needed     Return in about 1 year (around 06/20/2023) for depending on results.  Patient Care Team: Madelin Headings, MD as PCP - General Patient Instructions  Good to see you today glad you are healthy I suspect the weight loss is related to healthier lifestyle and recovering from an illness.  Lab results today Last Pap was January 2020 negative HPV We will order Cologuard We will refill Ambien dispense 20 refill x2 Good luck with your travels. Consider bone density next year. Continue weightbearing exercise as possible.  Neta Mends. Maor Meckel M.D.

## 2022-06-19 NOTE — Patient Instructions (Signed)
Good to see you today glad you are healthy I suspect the weight loss is related to healthier lifestyle and recovering from an illness.  Lab results today Last Pap was January 2020 negative HPV We will order Cologuard We will refill Ambien dispense 20 refill x2 Good luck with your travels. Consider bone density next year. Continue weightbearing exercise as possible.

## 2022-06-20 LAB — CBC WITH DIFFERENTIAL/PLATELET
Basophils Absolute: 0 10*3/uL (ref 0.0–0.1)
Basophils Relative: 0.7 % (ref 0.0–3.0)
Eosinophils Absolute: 0.1 10*3/uL (ref 0.0–0.7)
Eosinophils Relative: 1.2 % (ref 0.0–5.0)
HCT: 44.8 % (ref 36.0–46.0)
Hemoglobin: 15 g/dL (ref 12.0–15.0)
Lymphocytes Relative: 32.8 % (ref 12.0–46.0)
Lymphs Abs: 2.1 10*3/uL (ref 0.7–4.0)
MCHC: 33.6 g/dL (ref 30.0–36.0)
MCV: 90.2 fl (ref 78.0–100.0)
Monocytes Absolute: 0.4 10*3/uL (ref 0.1–1.0)
Monocytes Relative: 6.2 % (ref 3.0–12.0)
Neutro Abs: 3.7 10*3/uL (ref 1.4–7.7)
Neutrophils Relative %: 59.1 % (ref 43.0–77.0)
Platelets: 217 10*3/uL (ref 150.0–400.0)
RBC: 4.96 Mil/uL (ref 3.87–5.11)
RDW: 14 % (ref 11.5–15.5)
WBC: 6.3 10*3/uL (ref 4.0–10.5)

## 2022-06-20 LAB — TSH: TSH: 2.05 u[IU]/mL (ref 0.35–5.50)

## 2022-06-20 LAB — HEPATIC FUNCTION PANEL
ALT: 16 U/L (ref 0–35)
AST: 22 U/L (ref 0–37)
Albumin: 4.5 g/dL (ref 3.5–5.2)
Alkaline Phosphatase: 75 U/L (ref 39–117)
Bilirubin, Direct: 0.2 mg/dL (ref 0.0–0.3)
Total Bilirubin: 1.4 mg/dL — ABNORMAL HIGH (ref 0.2–1.2)
Total Protein: 7.1 g/dL (ref 6.0–8.3)

## 2022-06-20 LAB — LIPID PANEL
Cholesterol: 221 mg/dL — ABNORMAL HIGH (ref 0–200)
HDL: 106.3 mg/dL (ref >39.00)
LDL Cholesterol: 101 mg/dL — ABNORMAL HIGH (ref 0–99)
NonHDL: 114.88
Total CHOL/HDL Ratio: 2
Triglycerides: 68 mg/dL (ref 0.0–149.0)
VLDL: 13.6 mg/dL (ref 0.0–40.0)

## 2022-06-20 LAB — BASIC METABOLIC PANEL
BUN: 14 mg/dL (ref 6–23)
CO2: 26 mEq/L (ref 19–32)
Calcium: 10 mg/dL (ref 8.4–10.5)
Chloride: 104 mEq/L (ref 96–112)
Creatinine, Ser: 1.01 mg/dL (ref 0.40–1.20)
GFR: 59.03 mL/min — ABNORMAL LOW (ref >60.00)
Glucose, Bld: 91 mg/dL (ref 70–99)
Potassium: 4.3 mEq/L (ref 3.5–5.1)
Sodium: 139 mEq/L (ref 135–145)

## 2022-06-20 LAB — T4, FREE: Free T4: 0.85 ng/dL (ref 0.60–1.60)

## 2022-07-04 NOTE — Progress Notes (Signed)
As you have seen labs normal or favorable  except the creatinine is borderline  up ( ie decrease calculated GFR)  this may be an outlier .   But would recheck bmp   when hydrated .   To make sure  not a trend .   Staff please future order  bmp  to be done   about a month from last check   Dx  low gfr.

## 2022-07-05 ENCOUNTER — Other Ambulatory Visit: Payer: Self-pay

## 2022-07-05 DIAGNOSIS — R944 Abnormal results of kidney function studies: Secondary | ICD-10-CM

## 2022-07-14 LAB — COLOGUARD: COLOGUARD: NEGATIVE

## 2022-07-14 NOTE — Progress Notes (Signed)
Cologuard is negative  can repeat in 3 years .

## 2022-07-15 ENCOUNTER — Other Ambulatory Visit (INDEPENDENT_AMBULATORY_CARE_PROVIDER_SITE_OTHER): Payer: 59

## 2022-07-15 ENCOUNTER — Encounter: Payer: Self-pay | Admitting: Internal Medicine

## 2022-07-15 DIAGNOSIS — R944 Abnormal results of kidney function studies: Secondary | ICD-10-CM | POA: Diagnosis not present

## 2022-07-15 LAB — BASIC METABOLIC PANEL
BUN: 12 mg/dL (ref 6–23)
CO2: 25 mEq/L (ref 19–32)
Calcium: 9.5 mg/dL (ref 8.4–10.5)
Chloride: 105 mEq/L (ref 96–112)
Creatinine, Ser: 0.87 mg/dL (ref 0.40–1.20)
GFR: 70.56 mL/min (ref >60.00)
Glucose, Bld: 93 mg/dL (ref 70–99)
Potassium: 4.3 mEq/L (ref 3.5–5.1)
Sodium: 140 mEq/L (ref 135–145)

## 2022-07-15 NOTE — Progress Notes (Signed)
Results are now normal  routine follow

## 2022-10-24 ENCOUNTER — Ambulatory Visit: Payer: 59 | Admitting: Orthopaedic Surgery

## 2022-10-24 ENCOUNTER — Ambulatory Visit (INDEPENDENT_AMBULATORY_CARE_PROVIDER_SITE_OTHER): Payer: 59

## 2022-10-24 ENCOUNTER — Encounter: Payer: Self-pay | Admitting: Orthopaedic Surgery

## 2022-10-24 DIAGNOSIS — M1711 Unilateral primary osteoarthritis, right knee: Secondary | ICD-10-CM

## 2022-10-24 DIAGNOSIS — M79644 Pain in right finger(s): Secondary | ICD-10-CM

## 2022-10-24 DIAGNOSIS — M79641 Pain in right hand: Secondary | ICD-10-CM | POA: Diagnosis not present

## 2022-10-24 NOTE — Progress Notes (Signed)
Office Visit Note   Patient: Christina Wilkins           Date of Birth: Jan 22, 1956           MRN: [id number] Visit Date: 10/24/2022              Requested by: Burnis Medin, MD [place],   [id number] PCP: Burnis Medin, MD   Assessment & Plan: Visit Diagnoses:  1. Pain in right hand   2. Arthritis of right knee   3. Pain in right finger(s)     Plan: Christina Wilkins is a pleasant active 66 year old woman who is here today complaining of right long finger pain at the IP joint.  She said 2 months ago she was pulled by a dog and went down on her hand and her outstretched wrist.  She was more concerned about her wrist but that seemed to improve with time.  She did notice that her right long finger seem to be dislocated and she did reduce it.  She is still continued pain and swelling about the IP joint.  X-rays do not demonstrate any dislocation today she might have some small avulsion injuries at the IP joint.  Her range of motion is actually quite good and she is able to flex and extend the finger.  We reassured her this should get better over time.  She also has a history of right knee arthritis and is beginning to have pain at night that awakens her.  She would like to stay active and continue hiking.  She is not really gotten better with steroid injections.  We discussed that when she was ready we would refer her to Dr. Ninfa Linden to discuss knee replacement surgery  Follow-Up Instructions: Return if symptoms worsen or fail to improve.   Orders:  Orders Placed This Encounter  Procedures   XR Finger Middle Right   No orders of the defined types were placed in this encounter.     Procedures: No procedures performed   Clinical Data: No additional findings.   Subjective: Chief Complaint  Patient presents with   Right Hand - Pain    HPI Christina Wilkins is a pleasant 66 year old woman with a 77-month history of pain over the IP joint of her right long  finger.  This occurred after being pulled down by a dog.  She also has some concerns for her wrist but this seems to have gotten better.  Review of Systems  All other systems reviewed and are negative.    Objective: Vital Signs: There were no vitals taken for this visit.  Physical Exam Constitutional:      Appearance: Normal appearance.  Pulmonary:     Effort: Pulmonary effort is normal.  Skin:    General: Skin is warm and dry.  Neurological:     Mental Status: She is alert.    Ortho Exam Examination of her right long finger shows no redness she has some soft tissue swelling at the IP joint.  She has no tenderness of the PIP or DIP joint.  She is tender slightly with the IP joint.  She has full extension of the finger and flexes the finger almost down to her palmar surface.  She has no instability.  Capillary refill is less than 2 seconds sensation is intact Specialty Comments:  No specialty comments available.  Imaging: XR Finger Middle Right  Result Date: 10/24/2022 Radiographs of the right long finger were reviewed today.  Well-maintained alignment no evidence of dislocation.  No evidence of fracture.  There are faint calcifications within the volar capsule of the PIP joint with some surrounding soft tissue swelling.  None of this is acute    PMFS History: Patient Active Problem List   Diagnosis Date Noted   Pain in right finger(s) 10/24/2022   Pain in left shoulder 03/07/2021   Pain in right ankle and joints of right foot 03/07/2021   Chronic meniscal tear of knee 09/07/2020   Arthritis of right knee 09/07/2020   Subluxation of tendon of long head of biceps 02/25/2018   Cyst of knee joint, right 02/25/2018   Paresthesia of both hands 06/16/2015   Menopausal and perimenopausal disorder 04/06/2013   Medication management 04/06/2013   H/O sleep disturbance 04/12/2012   THYROIDITIS 09/14/2010   MENOPAUSAL SYNDROME 09/14/2010   THYROID STIMULATING HORMONE, ABNORMAL  08/29/2010   ALLERGIC RHINITIS 08/29/2010   Past Medical History:  Diagnosis Date   Allergic rhinitis    seasonal summer cough takes claritin   Varicella     Family History  Problem Relation Age of Onset   Breast cancer Mother 25   Hypothyroidism Mother    Arthritis Mother    Alcohol abuse Father    Hypertension Father    Allergies Sister    Alcohol abuse Brother     Past Surgical History:  Procedure Laterality Date   BUNIONECTOMY     right eye scar tissue on cornea removed  April 2011   Social History   Occupational History   Not on file  Tobacco Use   Smoking status: Never   Smokeless tobacco: Never  Vaping Use   Vaping Use: Never used  Substance and Sexual Activity   Alcohol use: Yes    Comment: per pt a beer once a week    Drug use: Never   Sexual activity: Not on file

## 2022-10-24 NOTE — Progress Notes (Deleted)
Create Note  OV 1  Create Note  OV 1  Consult 2  Procedure 3  Osteopathic Man 4  Wound Care 5  Post-Op 6  Add Note Speed Buttons by Body Location   Communications  My Note      Note Details  Service:  Orthopedics Create Note  OV 1  Consult 2  Procedure 3  Osteopathic Man 4  Wound Care 5  Post-Op 6  Add Note Speed Buttons by Body Location   Communications  My Note      Note Details  Service:  Orthopedics    Insert SmartText  Create Note  OV 1  Consult 2  Procedure 3  Osteopathic Man 4  Wound Care 5  Post-Op 6  Add Note Speed Buttons by Body Location   Communications  My Note      Note Details  Service:  Orthopedics    Insert SmartText    Office Visit Note   Patient: Christina Wilkins           Date of Birth: 1956/09/29           MRN: [id number] Visit Date: 10/24/2022              Requested by: Madelin Headings, MD [address] [place],  [place] [address] PCP: Madelin Headings, MD   Assessment & Plan: Visit Diagnoses:  1. Pain in right hand     Plan: ***  Follow-Up Instructions: No follow-ups on file.   Orders:  Orders Placed This Encounter  Procedures   XR Finger Middle Right   No orders of the defined types were placed in this encounter.     Procedures: No procedures performed   Clinical Data: No additional findings.   Subjective: Chief Complaint  Patient presents with   Right Hand - Pain    HPI  Review of Systems   Objective: Vital Signs: There were no vitals taken for this visit.  Physical Exam  Ortho Exam  Specialty Comments:  No specialty comments available.  Imaging: No results found.   PMFS History: Patient Active Problem List   Diagnosis Date Noted   Pain in left shoulder 03/07/2021   Pain in right ankle and joints of right foot 03/07/2021   Chronic meniscal tear of knee 09/07/2020   Arthritis of right knee 09/07/2020   Subluxation of  tendon of long head of biceps 02/25/2018   Cyst of knee joint, right 02/25/2018   Paresthesia of both hands 06/16/2015   Menopausal and perimenopausal disorder 04/06/2013   Medication management 04/06/2013   H/O sleep disturbance 04/12/2012   THYROIDITIS 09/14/2010   MENOPAUSAL SYNDROME 09/14/2010   THYROID STIMULATING HORMONE, ABNORMAL 08/29/2010   ALLERGIC RHINITIS 08/29/2010   Past Medical History:  Diagnosis Date   Allergic rhinitis    seasonal summer cough takes claritin   Varicella     Family History  Problem Relation Age of Onset   Breast cancer Mother 60   Hypothyroidism Mother    Arthritis Mother    Alcohol abuse Father    Hypertension Father    Allergies Sister    Alcohol abuse Brother     Past Surgical History:  Procedure Laterality Date   BUNIONECTOMY     right eye scar tissue on cornea removed  April 2011   Social History   Occupational History   Not on file  Tobacco Use   Smoking status: Never   Smokeless tobacco: Never  Vaping Use  Vaping Use: Never used  Substance and Sexual Activity   Alcohol use: Yes    Comment: per pt a beer once a week    Drug use: Never   Sexual activity: Not on file     Garald Balding, MD   Note - This record has been created using [employer].  Chart creation errors have been sought, but may not always  have been located. Such creation errors do not reflect on  the standard of medical care.      Insert SmartText   Consult 2  Procedure 3  Osteopathic Man 4  Wound Care 5  Post-Op 6  Add Note Speed Buttons by Body Location   Communications  My Note      Note Details  Service:  Orthopedics    Insert SmartText

## 2022-11-28 ENCOUNTER — Other Ambulatory Visit: Payer: Self-pay | Admitting: Internal Medicine

## 2022-11-28 DIAGNOSIS — Z1231 Encounter for screening mammogram for malignant neoplasm of breast: Secondary | ICD-10-CM

## 2023-02-18 ENCOUNTER — Ambulatory Visit: Payer: 59

## 2023-02-21 ENCOUNTER — Ambulatory Visit (INDEPENDENT_AMBULATORY_CARE_PROVIDER_SITE_OTHER): Payer: 59

## 2023-02-21 ENCOUNTER — Encounter: Payer: Self-pay | Admitting: Physician Assistant

## 2023-02-21 ENCOUNTER — Ambulatory Visit: Payer: 59 | Admitting: Physician Assistant

## 2023-02-21 DIAGNOSIS — M25531 Pain in right wrist: Secondary | ICD-10-CM

## 2023-02-21 DIAGNOSIS — M654 Radial styloid tenosynovitis [de Quervain]: Secondary | ICD-10-CM | POA: Insufficient documentation

## 2023-02-21 MED ORDER — METHYLPREDNISOLONE ACETATE 40 MG/ML IJ SUSP
20.0000 mg | INTRAMUSCULAR | Status: AC | PRN
  Administered 2023-02-21: 20 mg

## 2023-02-21 MED ORDER — LIDOCAINE HCL 1 % IJ SOLN
0.5000 mL | INTRAMUSCULAR | Status: AC | PRN
  Administered 2023-02-21: .5 mL

## 2023-02-21 NOTE — Progress Notes (Signed)
Office Visit Note   Patient: Christina Wilkins           Date of Birth: Nov 26, 1956           MRN: [id number] Visit Date: 02/21/2023              Requested by: Burnis Medin, MD [address] [place],  [place] [address] PCP: Burnis Medin, MD  Chief Complaint  Patient presents with  . Right Wrist - Pain      HPI: Christina Wilkins is a pleasant 67 year old woman who comes in today with a chief complaint of wrist pain.  We saw her last year with Dr. Gerhard Perches at which time she complained of long finger pain after a fall.  She did fall on her outstretched hand but did not really have any pain.  She is right-hand dominant but has noticed progression of pain recently has become quite symptomatic.  Most of her pain is over the first dorsal compartment compartment and the radial styloid.  Assessment & Plan: Visit Diagnoses:  1. Pain in right wrist   2. De Quervain's disease (radial styloid tenosynovitis)     Plan: She does have severe CMC arthritis in the first [facility] joint but this is not where she is terribly tender today and she actually has more progressive symptoms in the left hand.  She is tender in the first dorsal compartment with a positive Finkelstein's test.  Discussed going forward with an injection today.  We did inject her with 1/2 cc of lidocaine and half cc of Depo-Medrol.  She did have much less pain with repeat de Erskine Emery testing  Follow-Up Instructions: As needed  Ortho Exam  Patient is alert, oriented, no adenopathy, well-dressed, normal affect, normal respiratory effort. Right hand she is neurovascular intact no swelling no erythema she does have some deformity over both CMC joints actually left greater than right.  She has brisk capillary refill.  She has positive tenderness over the first dorsal compartment positive Finkelstein's test  Imaging: XR Wrist Complete Right  Result Date: 02/21/2023 Three-view radiographs of the right wrist were  obtained today.  She has severe arthritis of the first [facility] joint with subluxation.  Compared with previous films of 2021 this is advanced.  No other acute fractures well-maintained alignment.  No images are attached to the encounter.  Labs: Lab Results  Component Value Date   HGBA1C 5.3 06/16/2015   ESRSEDRATE 12 04/07/2020   ESRSEDRATE 13 06/16/2015   CRP <1.0 04/07/2020   CRP 0.2 (L) 06/16/2015     Lab Results  Component Value Date   ALBUMIN 4.5 06/19/2022   ALBUMIN 4.3 04/07/2020   ALBUMIN 4.3 01/22/2019    No results found for: "MG" No results found for: "VD25OH"  No results found for: "PREALBUMIN"    Latest Ref Rng & Units 06/19/2022    3:12 PM 04/07/2020    9:09 AM 01/22/2019   10:52 AM  CBC EXTENDED  WBC 4.0 - 10.5 K/uL 6.3  4.1  5.0   RBC 3.87 - 5.11 Mil/uL 4.96  4.76  4.93   Hemoglobin 12.0 - 15.0 g/dL 15.0  14.3  14.6   HCT 36.0 - 46.0 % 44.8  41.9  44.0   Platelets 150.0 - 400.0 K/uL 217.0  212.0  199.0   NEUT# 1.4 - 7.7 K/uL 3.7  2.6  3.0   Lymph# 0.7 - 4.0 K/uL 2.1  1.3  1.6      There is  no height or weight on file to calculate BMI.  Orders:  Orders Placed This Encounter  Procedures  . XR Wrist Complete Right   No orders of the defined types were placed in this encounter.    Procedures: Hand/UE Inj for de Quervain's tenosynovitis on 02/21/2023 10:42 AM Indications: diagnostic and therapeutic Details: 25 G needle, dorsal approach Medications: 0.5 mL lidocaine 1 %; 20 mg methylPREDNISolone acetate 40 MG/ML Outcome: tolerated well, no immediate complications Procedure, treatment alternatives, risks and benefits explained, specific risks discussed. Consent was given by the patient.    Clinical Data: No additional findings.  ROS:  All other systems negative, except as noted in the HPI. Review of Systems  Objective: Vital Signs: There were no vitals taken for this visit.  Specialty Comments:  No specialty comments available.  PMFS  History: Patient Active Problem List   Diagnosis Date Noted  . De Quervain's disease (radial styloid tenosynovitis) 02/21/2023  . Pain in right finger(s) 10/24/2022  . Pain in left shoulder 03/07/2021  . Pain in right ankle and joints of right foot 03/07/2021  . Chronic meniscal tear of knee 09/07/2020  . Arthritis of right knee 09/07/2020  . Subluxation of tendon of long head of biceps 02/25/2018  . Cyst of knee joint, right 02/25/2018  . Paresthesia of both hands 06/16/2015  . Menopausal and perimenopausal disorder 04/06/2013  . Medication management 04/06/2013  . H/O sleep disturbance 04/12/2012  . THYROIDITIS 09/14/2010  . MENOPAUSAL SYNDROME 09/14/2010  . THYROID STIMULATING HORMONE, ABNORMAL 08/29/2010  . ALLERGIC RHINITIS 08/29/2010   Past Medical History:  Diagnosis Date  . Allergic rhinitis    seasonal summer cough takes claritin  . Varicella     Family History  Problem Relation Age of Onset  . Breast cancer Mother 54  . Hypothyroidism Mother   . Arthritis Mother   . Alcohol abuse Father   . Hypertension Father   . Allergies Sister   . Alcohol abuse Brother     Past Surgical History:  Procedure Laterality Date  . BUNIONECTOMY    . right eye scar tissue on cornea removed  April 2011   Social History   Occupational History  . Not on file  Tobacco Use  . Smoking status: Never  . Smokeless tobacco: Never  Vaping Use  . Vaping Use: Never used  Substance and Sexual Activity  . Alcohol use: Yes    Comment: per pt a beer once a week   . Drug use: Never  . Sexual activity: Not on file

## 2023-04-02 ENCOUNTER — Ambulatory Visit
Admission: RE | Admit: 2023-04-02 | Discharge: 2023-04-02 | Disposition: A | Discharge status: Home / Self Care | Payer: 59 | Source: Ambulatory Visit | Attending: Internal Medicine | Admitting: Internal Medicine

## 2023-04-02 DIAGNOSIS — Z1231 Encounter for screening mammogram for malignant neoplasm of breast: Secondary | ICD-10-CM

## 2023-04-28 ENCOUNTER — Other Ambulatory Visit: Payer: Self-pay | Admitting: Physician Assistant

## 2023-04-28 ENCOUNTER — Telehealth: Payer: Self-pay | Admitting: Physician Assistant

## 2023-04-28 DIAGNOSIS — M1711 Unilateral primary osteoarthritis, right knee: Secondary | ICD-10-CM

## 2023-04-28 NOTE — Telephone Encounter (Signed)
Patient would like to speak with MA persons about Cortisone injections from last visit and about who to speak with about a knee replacement I advised patient I can set her an appt with a surgeon but she would like to apeak with Ma Persons first she would like to talk Via Mychart or phone call states it is not urgent

## 2023-06-02 ENCOUNTER — Ambulatory Visit (INDEPENDENT_AMBULATORY_CARE_PROVIDER_SITE_OTHER): Payer: Medicare Other | Admitting: Orthopaedic Surgery

## 2023-06-02 ENCOUNTER — Encounter: Payer: Self-pay | Admitting: Orthopaedic Surgery

## 2023-06-02 ENCOUNTER — Other Ambulatory Visit (INDEPENDENT_AMBULATORY_CARE_PROVIDER_SITE_OTHER): Payer: Medicare Other

## 2023-06-02 DIAGNOSIS — G8929 Other chronic pain: Secondary | ICD-10-CM | POA: Diagnosis not present

## 2023-06-02 DIAGNOSIS — M1712 Unilateral primary osteoarthritis, left knee: Secondary | ICD-10-CM

## 2023-06-02 DIAGNOSIS — M1711 Unilateral primary osteoarthritis, right knee: Secondary | ICD-10-CM | POA: Diagnosis not present

## 2023-06-02 DIAGNOSIS — M25562 Pain in left knee: Secondary | ICD-10-CM | POA: Diagnosis not present

## 2023-06-02 DIAGNOSIS — M25561 Pain in right knee: Secondary | ICD-10-CM

## 2023-06-02 NOTE — Progress Notes (Signed)
The patient is a very pleasant, active, thin and young appearing 67 year old female who is a well-established patient of the practice.  She has seen Dr. Cleophas Dunker for many years.  She is a Teacher, early years/pre as well.  She is an active hiker and traveler and does have intermittent pain in both knees with the right worse than left.  She had a previous MRI in 2021 of her right knee showing moderate arthritis in the medial lateral compartments and more significant arthritis of the patellofemoral joint.  She does not have any pain today but she does have intermittent times where her knee pain wakes her up at night and it points to her knees being more symptomatic in terms of arthritis.  She does occasionally get popping and catching in her right knee.  She has tried supplements such as turmeric and other supplements.  She stays away from NSAIDs secondary to her creatinine that has been rising.  She has not had surgery on her knees and tries to wear appropriate shoe wear and at times be careful on how she exercises.  She is otherwise very healthy 67 year old female.  X-rays of the right knee today also show a left knee on standing films.  There is slight medial and lateral narrowing on both knees but not bone-on-bone.  The right knee does have patellofemoral narrowing and osteophytes around the patellofemoral joint.  I did see the previous MRI of her right knee as well.  The left knee does show some narrowing.  Both knees hurt at the medial lateral joint space.  Both knees are examined and show some patellofemoral crepitation but excellent range of motion both knees are ligamentously stable with no effusion today.  We did talk about treatment modalities for her knees.  She will continue quad strengthening exercises and activity modification.  She is a perfect candidate for at least trying hyaluronic acid for her knees given that she cannot take anti-inflammatories due to higher creatinine and steroids not really good option  for long-term pain control from osteoarthritis.  She agrees with this treatment plan.  Will see if we get her approved for hyaluronic acid to treat the osteoarthritis pain from her knees.  All question concerns were answered and addressed.

## 2023-06-04 ENCOUNTER — Telehealth: Payer: Self-pay

## 2023-06-04 NOTE — Telephone Encounter (Signed)
VOB submitted for Monovisc, bilateral knee  

## 2023-06-04 NOTE — Telephone Encounter (Signed)
Bilateral knee gel injection  

## 2023-06-19 ENCOUNTER — Telehealth (HOSPITAL_BASED_OUTPATIENT_CLINIC_OR_DEPARTMENT_OTHER): Payer: Self-pay

## 2023-06-19 NOTE — Telephone Encounter (Signed)
Approved for Monovisc-Bilateral knee B&B No copay Covered at 100% No prior auth required

## 2023-06-19 NOTE — Telephone Encounter (Signed)
Called and scheduled

## 2023-07-15 ENCOUNTER — Ambulatory Visit (INDEPENDENT_AMBULATORY_CARE_PROVIDER_SITE_OTHER): Payer: Medicare Other | Admitting: Orthopaedic Surgery

## 2023-07-15 ENCOUNTER — Encounter: Payer: Self-pay | Admitting: Orthopaedic Surgery

## 2023-07-15 DIAGNOSIS — M1711 Unilateral primary osteoarthritis, right knee: Secondary | ICD-10-CM | POA: Diagnosis not present

## 2023-07-15 DIAGNOSIS — M25561 Pain in right knee: Secondary | ICD-10-CM

## 2023-07-15 DIAGNOSIS — G8929 Other chronic pain: Secondary | ICD-10-CM

## 2023-07-15 MED ORDER — LIDOCAINE HCL 1 % IJ SOLN
3.0000 mL | INTRAMUSCULAR | Status: AC | PRN
  Administered 2023-07-15: 3 mL

## 2023-07-15 MED ORDER — HYALURONAN 88 MG/4ML IX SOSY
88.0000 mg | PREFILLED_SYRINGE | INTRA_ARTICULAR | Status: AC | PRN
  Administered 2023-07-15: 88 mg via INTRA_ARTICULAR

## 2023-07-15 NOTE — Progress Notes (Signed)
   Procedure Note  Patient: Christina Wilkins             Date of Birth: 1956/05/19           MRN: [id number]             Visit Date: 07/15/2023  Procedures: Visit Diagnoses:  1. Chronic pain of right knee   2. Unilateral primary osteoarthritis, right knee     Large Joint Inj: R knee on 07/15/2023 3:19 PM Indications: diagnostic evaluation and pain Details: 22 G 1.5 in needle, superolateral approach  Arthrogram: No  Medications: 88 mg Hyaluronan 88 MG/4ML; 3 mL lidocaine 1 % Outcome: tolerated well, no immediate complications Procedure, treatment alternatives, risks and benefits explained, specific risks discussed. Consent was given by the patient. Immediately prior to procedure a time out was called to verify the correct patient, procedure, equipment, support staff and site/side marked as required. Patient was prepped and draped in the usual sterile fashion.    [name] for scheduled hyaluronic acid injection with Monovisc in her right knee to treat the pain from mild to moderate arthritis.  We were going to inject her left knee but she is becoming symptomatic with her left knee and really wants to hold off which I agree with this as well.  She is an active and young appearing 67 year old female.  Her right knee shows no effusion today at all.  I did place Monovisc in the right knee without difficulty.  All questions and concerns were answered and addressed.  Follow-up is as needed.  Lot #[id number]

## 2023-10-08 ENCOUNTER — Other Ambulatory Visit: Payer: Self-pay

## 2023-10-08 ENCOUNTER — Emergency Department (HOSPITAL_BASED_OUTPATIENT_CLINIC_OR_DEPARTMENT_OTHER): Payer: Medicare Other | Admitting: Radiology

## 2023-10-08 ENCOUNTER — Encounter (HOSPITAL_BASED_OUTPATIENT_CLINIC_OR_DEPARTMENT_OTHER): Payer: Self-pay | Admitting: Emergency Medicine

## 2023-10-08 ENCOUNTER — Emergency Department (HOSPITAL_BASED_OUTPATIENT_CLINIC_OR_DEPARTMENT_OTHER)
Admission: EM | Admit: 2023-10-08 | Discharge: 2023-10-08 | Disposition: A | Discharge status: Home / Self Care | Payer: Medicare Other

## 2023-10-08 ENCOUNTER — Other Ambulatory Visit (HOSPITAL_BASED_OUTPATIENT_CLINIC_OR_DEPARTMENT_OTHER): Payer: Self-pay

## 2023-10-08 DIAGNOSIS — W293XXA Contact with powered garden and outdoor hand tools and machinery, initial encounter: Secondary | ICD-10-CM | POA: Diagnosis not present

## 2023-10-08 DIAGNOSIS — S61211A Laceration without foreign body of left index finger without damage to nail, initial encounter: Secondary | ICD-10-CM | POA: Diagnosis not present

## 2023-10-08 DIAGNOSIS — M7989 Other specified soft tissue disorders: Secondary | ICD-10-CM | POA: Diagnosis not present

## 2023-10-08 DIAGNOSIS — S6992XA Unspecified injury of left wrist, hand and finger(s), initial encounter: Secondary | ICD-10-CM | POA: Diagnosis present

## 2023-10-08 DIAGNOSIS — S61311A Laceration without foreign body of left index finger with damage to nail, initial encounter: Secondary | ICD-10-CM

## 2023-10-08 MED ORDER — LIDOCAINE HCL 2 % IJ SOLN
20.0000 mL | Freq: Once | INTRAMUSCULAR | Status: AC
  Administered 2023-10-08: 400 mg via INTRADERMAL
  Filled 2023-10-08: qty 20

## 2023-10-08 MED ORDER — AMOXICILLIN 500 MG PO CAPS
500.0000 mg | ORAL_CAPSULE | Freq: Three times a day (TID) | ORAL | 0 refills | Status: DC
Start: 2023-10-08 — End: 2023-11-12

## 2023-10-08 NOTE — Discharge Instructions (Signed)
Please read and follow all provided instructions.  Your diagnoses today include:  1. Laceration of left index finger without foreign body with damage to nail, initial encounter    Tests performed today include: X-ray of the affected area: no significant tuft fracture  Vital signs. See below for your results today.   Medications prescribed:  Amoxicillin - antibiotic  You have been prescribed an antibiotic medicine: take the entire course of medicine even if you are feeling better. Stopping early can cause the antibiotic not to work.  Take any prescribed medications only as directed.   Home care instructions:  Follow any educational materials and wound care instructions contained in this packet.   Keep affected area above the level of your heart when possible to minimize swelling. Wash area gently twice a day with warm soapy water. Do not apply alcohol or hydrogen peroxide. Cover the area if it draining or weeping.   Follow-up instructions: Suture Removal: Return to the Emergency Department or see your primary care care doctor in 10 days for a recheck of your wound and removal of your sutures or staples.    Return instructions:  Return to the Emergency Department if you have: Fever Worsening pain Worsening swelling of the wound Pus draining from the wound Redness of the skin that moves away from the wound, especially if it streaks away from the affected area  Any other emergent concerns  Your vital signs today were: BP (!) 139/95   Pulse 90   Temp 98 F (36.7 C)   Resp 20   SpO2 100%  If your blood pressure (BP) was elevated above 135/85 this visit, please have this repeated by your doctor within one month. --------------

## 2023-10-08 NOTE — ED Triage Notes (Signed)
Trimming bushes, left index finger cut. No blood thinners

## 2023-10-08 NOTE — ED Provider Notes (Signed)
[place] EMERGENCY DEPARTMENT AT [facility] Provider Note   CSN: [id number] Arrival date & time: 10/08/23  1205     History  No chief complaint on file.   Christina Wilkins is a 67 y.o. female.  Patient presents to the emergency department for evaluation of left index finger tip laceration.  She was using an Publishing rights manager and accidentally caught her fingertip in the tremor.  Finger was wrapped prior to arrival due to bleeding.  Injury occurred just prior to arrival.  Tetanus up-to-date.  No weakness or numbness reported.  No other injuries.  She is not on anticoagulation.       Home Medications Prior to Admission medications   Medication Sig Start Date End Date Taking? Authorizing Provider  chlorpheniramine-HYDROcodone (TUSSIONEX PENNKINETIC ER) 10-8 MG/5ML Take 5 mLs by mouth every 12 (twelve) hours as needed for cough. Patient not taking: Reported on 06/19/2022 03/15/22   Kristian Covey, MD  diclofenac Sodium (VOLTAREN) 1 % GEL Apply topically 4 (four) times daily.    [provider]  fluorouracil (EFUDEX) 5 % cream as needed. 10/18/21   [provider]  Ginger, Zingiber officinalis, (GINGER PO) Take by mouth.    [provider]  Glucosamine-Chondroitin 750-600 MG TABS Take 1 tablet by mouth daily.    [provider]  loratadine (CLARITIN) 10 MG tablet Take 10 mg by mouth daily.    [provider]  Misc Natural Products (TART CHERRY ADVANCED PO) Take by mouth.    [provider]  Multiple Vitamin (MULTIVITAMIN) tablet Take 1 tablet by mouth daily.    [provider]  predniSONE (DELTASONE) 20 MG tablet Take two tablets by mouth once daily for 5 days. 03/18/22   Burchette, Elberta Fortis, MD  promethazine-codeine (PHENERGAN WITH CODEINE) 6.25-10 MG/5ML syrup Take 5 mLs by mouth every 6 (six) hours as needed for cough. 08/06/21   Panosh, Neta Mends, MD  tretinoin (RETIN-A) 0.025 % cream  08/20/21    [provider]  TURMERIC PO Take 1 capsule by mouth daily.    [provider]  zolpidem (AMBIEN) 5 MG tablet Take 1 tablet (5 mg total) by mouth at bedtime as needed. for sleep travel 06/19/22   Panosh, Neta Mends, MD      Allergies    Patient has no known allergies.    Review of Systems   Review of Systems  Physical Exam Updated Vital Signs BP (!) 139/95   Pulse 90   Temp 98 F (36.7 C)   Resp 20   SpO2 100%   Physical Exam Vitals and nursing note reviewed.  Constitutional:      Appearance: She is well-developed.  HENT:     Head: Normocephalic and atraumatic.  Eyes:     Conjunctiva/sclera: Conjunctivae normal.  Pulmonary:     Effort: No respiratory distress.  Musculoskeletal:     Cervical back: Normal range of motion and neck supple.     Comments: Patient is able to flex and extend her left index finger at the DIP, PIP, and MCP joints.  Capillary refill less than 2 seconds.  Skin:    General: Skin is warm and dry.     Comments: Left index finger: Wound was explored after application of a finger tourniquet.  There is a fingertip laceration noted that is extremely irregular with branching of the wound itself.  Wound edges mildly macerated, but no skin avulsion suspected.  Mild venous oozing after the bandage was taken  down.  Wound was copiously irrigated with normal saline from a bottle with a pressure.  Patient tolerated this well after anesthesia.  Wound base appears clean.  I do not see any signs of foreign body.  I do not see any signs of visible bony injury.  There was a small fragment of nail plate that was removed at the periphery of the laceration.  No active bleeding from the nailbed.  If any nailbed laceration exists, I suspect that it is minimal.  Neurological:     Mental Status: She is alert.     ED Results / Procedures / Treatments   Labs (all labs ordered are listed, but only abnormal results are displayed) Labs Reviewed - No data to  display  EKG None  Radiology DG Finger Index Left  Result Date: 10/08/2023 CLINICAL DATA:  laceration, eval tuft fracture. EXAM: LEFT INDEX FINGER 2+V COMPARISON:  07/12/2020. FINDINGS: No acute fracture or dislocation. No aggressive osseous lesion. No significant arthritis of imaged joints. No radiopaque foreign bodies. Soft tissues are within normal limits. IMPRESSION: No acute fracture or dislocation. Electronically Signed   By: Jules Schick M.D.   On: 10/08/2023 16:18    Procedures .Marland KitchenLaceration Repair  Date/Time: 10/08/2023 1:00 PM  Performed by: Renne Crigler, PA-C Authorized by: Renne Crigler, PA-C   Consent:    Consent obtained:  Verbal   Consent given by:  Patient   Risks discussed:  Infection, pain, retained foreign body, nerve damage, poor wound healing, need for additional repair and vascular damage   Alternatives discussed:  No treatment Universal protocol:    Patient identity confirmed:  Verbally with patient and provided demographic data Anesthesia:    Anesthesia method:  Nerve block and local infiltration   Local anesthetic:  Lidocaine 2% w/o epi   Block location:  L index finger tip   Block needle gauge:  27 G   Block anesthetic:  Lidocaine 2% w/o epi   Block technique:  3-sided ring block   Block injection procedure:  Anatomic landmarks identified, introduced needle, incremental injection, anatomic landmarks palpated and negative aspiration for blood   Block outcome:  Anesthesia achieved Laceration details:    Location:  Finger   Finger location:  L index finger   Length (cm):  2 Pre-procedure details:    Preparation:  Patient was prepped and draped in usual sterile fashion and imaging obtained to evaluate for foreign bodies Exploration:    Imaging obtained: x-ray     Imaging outcome: foreign body not noted     Wound exploration: wound explored through full range of motion and entire depth of wound visualized     Wound extent: no foreign body and no  underlying fracture     Contaminated: no   Treatment:    Area cleansed with:  Saline   Amount of cleaning:  Extensive   Irrigation solution:  Sterile saline   Irrigation volume:  1000cc   Irrigation method:  Pressure wash (with bottle cap)   Visualized foreign bodies/material removed: no     Debridement:  None Skin repair:    Repair method:  Sutures   Suture size:  5-0   Suture material:  Nylon   Suture technique:  Simple interrupted   Number of sutures:  6 Approximation:    Approximation:  Close Repair type:    Repair type:  Intermediate Post-procedure details:    Dressing:  Open (no dressing)   Procedure completion:  Tolerated well, no immediate complications Comments:  Patient tolerated well.  She did require small amount of local anesthesia towards the end of the suture placement for complete anesthesia of the very distal end of the fingertip.  We did discuss that often times there is some nerve damage, to the cutaneous nerves, from these types of irregular wounds.  She may expect numbness or burning pain in the fingertip due to the mechanism of injury. This may improve slowly over time but could potentially not completely resolve.  However she would want to follow-up with any persistent symptoms or complications.     Medications Ordered in ED Medications  lidocaine (XYLOCAINE) 2 % (with pres) injection 400 mg (400 mg Intradermal Given 10/08/23 1302)    ED Course/ Medical Decision Making/ A&P    Patient seen and examined. History obtained directly from patient.   Labs/EKG: None ordered  Imaging: Ordered x-ray of the index finger.  Medications/Fluids: Ordered: Lidocaine 2% without epinephrine  Most recent vital signs reviewed and are as follows: BP (!) 139/95   Pulse 90   Temp 98 F (36.7 C)   Resp 20   SpO2 100%   Initial impression: Irregular fingertip laceration from hedge trimmer.   Patient tolerated nerve block, wound cleaning and exploration, repair  fairly well.  She did require a small amount of local anesthesia to achieve complete anesthesia during the end of suture placement.  Reassessment performed. Patient appears comfortable. Exam unchanged.  Finger splint was placed.  Imaging personally visualized and interpreted including: X-ray of the finger, no visible tuft fracture  Reviewed pertinent lab work and imaging with patient at bedside. Questions answered.   Most current vital signs reviewed and are as follows: BP (!) 139/95   Pulse 90   Temp 98 F (36.7 C)   Resp 20   SpO2 100%   Plan: Discharge to home.   Prescriptions written for: Given location and mechanism of the wound, while cutting organic material, 5 days of amoxicillin given.  Patient would prefer amoxicillin over Keflex.  Other home care instructions discussed: Patient counseled on wound care.    ED return instructions discussed: Patient was urged to return to the Emergency Department urgently with worsening pain, swelling, expanding erythema especially if it streaks away from the affected area, fever, or if they have any other concerns.   Follow-up instructions discussed: Patient counseled on need to return or see PCP/urgent care for suture removal in 10-14 days.  She has a previously scheduled follow-up with her PCP next week.                                 Medical Decision Making Amount and/or Complexity of Data Reviewed Radiology: ordered.  Risk Prescription drug management.   Fingertip laceration from electric hedge trimmer.  No visible bony involvement or by x-ray, but due to location and mechanism will give 5 days of amox. Wound was very irregular but was able to be pieced back together without difficulty.  There are a couple small flaps which need to be tucked back into place with partially buried suture.  No visible foreign bodies.  Wound was copiously irrigated.  This is a high risk wound for residual superficial nerve injury.  Range of motion is  fully intact.  Patient has good outpatient follow-up.         Final Clinical Impression(s) / ED Diagnoses Final diagnoses:  Laceration of left index finger without foreign body with damage  to nail, initial encounter    Rx / DC Orders ED Discharge Orders          Ordered    amoxicillin (AMOXIL) 500 MG capsule  3 times daily        10/08/23 1428              Renne Crigler, PA-C 10/09/23 1115    Coral Spikes, DO 10/17/23 1512

## 2023-10-08 NOTE — ED Notes (Signed)
Non-adhesive bandage and coban applied along with static splint to left index finger. CNS intact prior to and after application.

## 2023-10-17 ENCOUNTER — Encounter: Payer: Self-pay | Admitting: Family Medicine

## 2023-10-17 ENCOUNTER — Ambulatory Visit (INDEPENDENT_AMBULATORY_CARE_PROVIDER_SITE_OTHER): Payer: Medicare Other | Admitting: Family Medicine

## 2023-10-17 VITALS — BP 112/60 | HR 73 | Temp 97.9°F | Ht 67.0 in | Wt 142.7 lb

## 2023-10-17 DIAGNOSIS — Z4802 Encounter for removal of sutures: Secondary | ICD-10-CM | POA: Diagnosis not present

## 2023-10-17 DIAGNOSIS — S61211D Laceration without foreign body of left index finger without damage to nail, subsequent encounter: Secondary | ICD-10-CM

## 2023-10-17 NOTE — Patient Instructions (Signed)
Consider some topical vaseline on wound to prevent excessive drying

## 2023-10-17 NOTE — Progress Notes (Signed)
Established Patient Office Visit  Subjective   Patient ID: Christina Wilkins, female    DOB: 31-May-1956  Age: 67 y.o. MRN: [id number]  Chief Complaint  Patient presents with   Suture / Staple Removal    HPI   Christina Wilkins is seen for follow-up regarding a left index finger laceration for suture removal.  She was seen in the ER 9 days ago.  She accidentally cut her left index finger with hedge tremors.  Last tetanus was 4 years ago.  5 sutures were placed.  She has a little bit of soreness but no redness or drainage.  Past Medical History:  Diagnosis Date   Allergic rhinitis    seasonal summer cough takes claritin   Varicella    Past Surgical History:  Procedure Laterality Date   BUNIONECTOMY     right eye scar tissue on cornea removed  April 2011    reports that she has never smoked. She has never used smokeless tobacco. She reports current alcohol use. She reports that she does not use drugs. family history includes Alcohol abuse in her brother and father; Allergies in her sister; Arthritis in her mother; Breast cancer (age of onset: 90) in her mother; Hypertension in her father; Hypothyroidism in her mother. No Known Allergies  Review of Systems  Constitutional:  Negative for chills and fever.      Objective:     BP 112/60 (BP Location: Left Arm, Patient Position: Sitting, Cuff Size: Normal)   Pulse 73   Temp 97.9 F (36.6 C) (Oral)   Ht 5\' 7"  (1.702 m)   Wt 142 lb 11.2 oz (64.7 kg)   SpO2 100%   BMI 22.35 kg/m  BP Readings from Last 3 Encounters:  10/17/23 112/60  10/08/23 (!) 139/95  06/19/22 106/74   Wt Readings from Last 3 Encounters:  10/17/23 142 lb 11.2 oz (64.7 kg)  06/19/22 143 lb 3.2 oz (65 kg)  03/15/22 148 lb 3.2 oz (67.2 kg)      Physical Exam Vitals reviewed.  Constitutional:      General: She is not in acute distress.    Appearance: Normal appearance.  Cardiovascular:     Rate and Rhythm: Normal rate and regular rhythm.  Skin:     Comments: Left index finger reveals healing laceration distally.  5 sutures removed without difficulty.  No erythema.  No warmth.  No drainage.  Normal sensory function to touch.  Neurological:     Mental Status: She is alert.      No results found for any visits on 10/17/23.    The ASCVD Risk score (Arnett DK, et al., 2019) failed to calculate for the following reasons:   The valid HDL cholesterol range is 20 to 100 mg/dL    Assessment & Plan:    Here for follow-up regarding left index finger laceration for suture removal.  5 sutures removed without difficulty.  She has some dryness of the skin but no signs of secondary infection.  Suggested some topical Vaseline couple times daily to avoid drying and cracking.  Follow-up as needed     Evelena Peat, MD

## 2023-10-20 ENCOUNTER — Ambulatory Visit: Payer: Medicare Other | Admitting: Family Medicine

## 2023-11-12 ENCOUNTER — Encounter: Payer: Self-pay | Admitting: Internal Medicine

## 2023-11-12 ENCOUNTER — Ambulatory Visit: Payer: Medicare Other | Admitting: Internal Medicine

## 2023-11-12 VITALS — BP 116/90 | HR 78 | Temp 97.9°F | Ht 66.5 in | Wt 141.4 lb

## 2023-11-12 DIAGNOSIS — Z0000 Encounter for general adult medical examination without abnormal findings: Secondary | ICD-10-CM

## 2023-11-12 DIAGNOSIS — M150 Primary generalized (osteo)arthritis: Secondary | ICD-10-CM

## 2023-11-12 DIAGNOSIS — Z0001 Encounter for general adult medical examination with abnormal findings: Secondary | ICD-10-CM

## 2023-11-12 DIAGNOSIS — E069 Thyroiditis, unspecified: Secondary | ICD-10-CM

## 2023-11-12 DIAGNOSIS — R944 Abnormal results of kidney function studies: Secondary | ICD-10-CM

## 2023-11-12 DIAGNOSIS — E2839 Other primary ovarian failure: Secondary | ICD-10-CM | POA: Diagnosis not present

## 2023-11-12 DIAGNOSIS — Z79899 Other long term (current) drug therapy: Secondary | ICD-10-CM

## 2023-11-12 LAB — LIPID PANEL
Cholesterol: 218 mg/dL — ABNORMAL HIGH (ref 0–200)
HDL: 102.1 mg/dL (ref >39.00)
LDL Cholesterol: 104 mg/dL — ABNORMAL HIGH (ref 0–99)
NonHDL: 115.83
Total CHOL/HDL Ratio: 2
Triglycerides: 61 mg/dL (ref 0.0–149.0)
VLDL: 12.2 mg/dL (ref 0.0–40.0)

## 2023-11-12 LAB — COMPREHENSIVE METABOLIC PANEL
ALT: 12 U/L (ref 0–35)
AST: 18 U/L (ref 0–37)
Albumin: 4.4 g/dL (ref 3.5–5.2)
Alkaline Phosphatase: 75 U/L (ref 39–117)
BUN: 17 mg/dL (ref 6–23)
CO2: 28 meq/L (ref 19–32)
Calcium: 9.7 mg/dL (ref 8.4–10.5)
Chloride: 104 meq/L (ref 96–112)
Creatinine, Ser: 0.88 mg/dL (ref 0.40–1.20)
GFR: 68.96 mL/min (ref >60.00)
Glucose, Bld: 91 mg/dL (ref 70–99)
Potassium: 4.3 meq/L (ref 3.5–5.1)
Sodium: 138 meq/L (ref 135–145)
Total Bilirubin: 1.3 mg/dL — ABNORMAL HIGH (ref 0.2–1.2)
Total Protein: 7.1 g/dL (ref 6.0–8.3)

## 2023-11-12 LAB — CBC WITH DIFFERENTIAL/PLATELET
Basophils Absolute: 0 10*3/uL (ref 0.0–0.1)
Basophils Relative: 0.4 % (ref 0.0–3.0)
Eosinophils Absolute: 0 10*3/uL (ref 0.0–0.7)
Eosinophils Relative: 0.9 % (ref 0.0–5.0)
HCT: 45.5 % (ref 36.0–46.0)
Hemoglobin: 14.8 g/dL (ref 12.0–15.0)
Lymphocytes Relative: 31.6 % (ref 12.0–46.0)
Lymphs Abs: 1.6 10*3/uL (ref 0.7–4.0)
MCHC: 32.5 g/dL (ref 30.0–36.0)
MCV: 88.2 fL (ref 78.0–100.0)
Monocytes Absolute: 0.3 10*3/uL (ref 0.1–1.0)
Monocytes Relative: 5.6 % (ref 3.0–12.0)
Neutro Abs: 3 10*3/uL (ref 1.4–7.7)
Neutrophils Relative %: 61.5 % (ref 43.0–77.0)
Platelets: 223 10*3/uL (ref 150.0–400.0)
RBC: 5.16 Mil/uL — ABNORMAL HIGH (ref 3.87–5.11)
RDW: 14.7 % (ref 11.5–15.5)
WBC: 4.9 10*3/uL (ref 4.0–10.5)

## 2023-11-12 LAB — TSH: TSH: 1.73 u[IU]/mL (ref 0.35–5.50)

## 2023-11-12 LAB — HEMOGLOBIN A1C: Hgb A1c MFr Bld: 5.6 % (ref 4.6–6.5)

## 2023-11-12 NOTE — Patient Instructions (Addendum)
Good to see you today  Continue lifestyle intervention healthy eating and exercise .   Review   then fill the MOST form and then get back with  me and we can go over and sign   and copy to record . Consider dexa scan baseline

## 2023-11-12 NOTE — Progress Notes (Signed)
Chief Complaint  Patient presents with   Medicare Wellness    Pt is fasted for blood work.     HPI: Patient  Christina Wilkins  67 y.o. comes in today for Welcome to medicare  visit  Generally well  dx of import is osteoarthritis in every joint runs in family  no meds help that much but continues to stay active and exercise e BP : Diastolic  usually 70  .  Just came from gym Skin :Dr Delman Cheadle I ss and 5 fu  Asks about the MOST form since she lives alone  .  Does have hcpoa .   No hx of fracture .   Health Maintenance  Topic Date Due   DEXA SCAN  Never done   COVID-19 Vaccine (4 - 2023-24 season) 11/28/2023 (Originally 11/02/2023)   Colonoscopy  12/12/2023 (Originally 12/30/2018)   Pneumonia Vaccine 38+ Years old (1 of 1 - PCV) 11/11/2024 (Originally 06/07/2023)   HIV Screening  11/11/2024 (Originally 06/06/1971)   Cervical Cancer Screening (HPV/Pap Cotest)  01/23/2024   Medicare Annual Wellness (AWV)  11/11/2024   MAMMOGRAM  04/01/2025   DTaP/Tdap/Td (3 - Td or Tdap) 02/19/2029   INFLUENZA VACCINE  Completed   Hepatitis C Screening  Completed   Zoster Vaccines- Shingrix  Completed   HPV VACCINES  Aged Out   Health Maintenance Review LIFESTYLE:  Exercise:   a lot  4 1 hour gym and hiking  Tobacco/ETS: n Alcohol:  1-2 per week Sugar beverages: no Sleep: 10 - 7 interrupted  Drug use: no HH of 1  dog and cat   Mom in 90s   lives out of state and has to travel to help a good bit  Daughter 1 hasn't seen in years  one in [place]     ROS:  GEN/ HEENT: No fever, significant weight changes sweats headaches vision problems hearing changes, CV/ PULM; No chest pain shortness of breath cough, syncope,edema  change in exercise tolerance. GI /GU: No adominal pain, vomiting, change in bowel habits. No blood in the stool. No significant GU symptoms. SKIN/HEME: ,no acute skin rashes suspicious lesions or bleeding. No lymphadenopathy, nodules, masses.  NEURO/ PSYCH:  No  neurologic signs such as weakness numbness. External factors for mood with family but coping  IMM/ Allergy: No unusual infections.  Allergy .   REST of 12 system review negative except as per HPI   Past Medical History:  Diagnosis Date   Allergic rhinitis    seasonal summer cough takes claritin   Varicella     Past Surgical History:  Procedure Laterality Date   BUNIONECTOMY     right eye scar tissue on cornea removed  April 2011    Family History  Problem Relation Age of Onset   Breast cancer Mother 37   Hypothyroidism Mother    Arthritis Mother    Alcohol abuse Father    Hypertension Father    Allergies Sister    Alcohol abuse Brother     Social History   Socioeconomic History   Marital status: Single    Spouse name: Not on file   Number of children: Not on file   Years of education: Not on file   Highest education level: Not on file  Occupational History   Not on file  Tobacco Use   Smoking status: Never   Smokeless tobacco: Never  Vaping Use   Vaping status: Never Used  Substance and Sexual Activity   Alcohol use: Yes  Comment: per pt a beer once a week    Drug use: Never   Sexual activity: Not on file  Other Topics Concern   Not on file  Social History Narrative   Single pharmacist   Regular Exercise- yes   hh of 3   Two adopted children    Social Determinants of Health   Financial Resource Strain: Low Risk  (11/12/2023)   Overall Financial Resource Strain (CARDIA)    Difficulty of Paying Living Expenses: Not hard at all  Food Insecurity: No Food Insecurity (11/12/2023)   Hunger Vital Sign    Worried About Running Out of Food in the Last Year: Never true    Ran Out of Food in the Last Year: Never true  Transportation Needs: No Transportation Needs (11/12/2023)   PRAPARE - Administrator, Civil Service (Medical): No    Lack of Transportation (Non-Medical): No  Physical Activity: Sufficiently Active (11/12/2023)   Exercise Vital  Sign    Days of Exercise per Week: 3 days    Minutes of Exercise per Session: 150+ min  Stress: Stress Concern Present (11/12/2023)   [employer] of Occupational Health - Occupational Stress Questionnaire    Feeling of Stress : Very much  Social Connections: Moderately Integrated (11/12/2023)   Social Connection and Isolation Panel [NHANES]    Frequency of Communication with Friends and Family: Three times a week    Frequency of Social Gatherings with Friends and Family: Twice a week    Attends Religious Services: More than 4 times per year    Active Member of [employer] or Organizations: Yes    Attends Engineer, structural: More than 4 times per year    Marital Status: Never married    Outpatient Medications Prior to Visit  Medication Sig Dispense Refill   diclofenac Sodium (VOLTAREN) 1 % GEL Apply topically 4 (four) times daily.     fluorouracil (EFUDEX) 5 % cream as needed.     Ginger, Zingiber officinalis, (GINGER PO) Take by mouth.     loratadine (CLARITIN) 10 MG tablet Take 10 mg by mouth daily as needed.     Misc Natural Products (TART CHERRY ADVANCED PO) Take by mouth.     Multiple Vitamin (MULTIVITAMIN) tablet Take 1 tablet by mouth daily.     tretinoin (RETIN-A) 0.025 % cream      TURMERIC PO Take 1 capsule by mouth daily.     zolpidem (AMBIEN) 5 MG tablet Take 1 tablet (5 mg total) by mouth at bedtime as needed. for sleep travel 20 tablet 2   chlorpheniramine-HYDROcodone (TUSSIONEX PENNKINETIC ER) 10-8 MG/5ML Take 5 mLs by mouth every 12 (twelve) hours as needed for cough. (Patient not taking: Reported on 11/12/2023) 115 mL 0   promethazine-codeine (PHENERGAN WITH CODEINE) 6.25-10 MG/5ML syrup Take 5 mLs by mouth every 6 (six) hours as needed for cough. (Patient not taking: Reported on 11/12/2023) 180 mL 0   amoxicillin (AMOXIL) 500 MG capsule Take 1 capsule (500 mg total) by mouth 3 (three) times daily. 15 capsule 0   Glucosamine-Chondroitin 750-600 MG TABS Take 1  tablet by mouth daily.     predniSONE (DELTASONE) 20 MG tablet Take two tablets by mouth once daily for 5 days. 10 tablet 0   No facility-administered medications prior to visit.     EXAM:  BP (!) 116/90 (BP Location: Right Arm, Patient Position: Sitting, Cuff Size: Normal)   Pulse 78   Temp 97.9 F (36.6 C) (Oral)  Ht 5' 6.5" (1.689 m)   Wt 141 lb 6.4 oz (64.1 kg)   SpO2 98%   BMI 22.48 kg/m   Body mass index is 22.48 kg/m. Wt Readings from Last 3 Encounters:  11/12/23 141 lb 6.4 oz (64.1 kg)  10/17/23 142 lb 11.2 oz (64.7 kg)  06/19/22 143 lb 3.2 oz (65 kg)    Physical Exam: Vital signs reviewed WUJ:WJXB is a well-developed well-nourished alert cooperative    who appearsr stated age in no acute distress.  HEENT: normocephalic atraumatic , Eyes: PERRL EOM's full, conjunctiva clear, Nares: paten,t no deformity discharge or tenderness., Ears: no deformity EAC's clear TMs with normal landmarks. Mouth: clear OP, no lesions, edema.  Moist mucous membranes. Dentition in adequate repair. NECK: supple without masses, thyromegaly or bruits. CHEST/PULM:  Clear to auscultation and percussion breath sounds equal no wheeze , rales or rhonchi.  CV: PMI is nondisplaced, S1 S2 no gallops, murmurs, rubs. Peripheral pulses are full without delay.No JVD .  ABDOMEN: Bowel sounds normal nontender  No guard or rebound, no hepato splenomegal no CVA tenderness.  Extremtities:  No clubbing cyanosis or edema, no acute joint swelling or redness no focal atrophy  djd oa changes  hands   NEURO:  Oriented x3, cranial nerves 3-12 appear to be intact, no obvious focal weakness,gait within normal limits no abnormal reflexes SKIN: No acute rashes normal turgor, color, no bruising or petechiae. Sun changes  PSYCH: Oriented, good eye contact, no obvious depression anxiety, cognition and judgment appear normal. LN: no cervical axillary i adenopathy  Lab Results  Component Value Date   WBC 6.3 06/19/2022    HGB 15.0 06/19/2022   HCT 44.8 06/19/2022   PLT 217.0 06/19/2022   GLUCOSE 93 07/15/2022   CHOL 221 (H) 06/19/2022   TRIG 68.0 06/19/2022   HDL 106.30 06/19/2022   LDLCALC 101 (H) 06/19/2022   ALT 16 06/19/2022   AST 22 06/19/2022   NA 140 07/15/2022   K 4.3 07/15/2022   CL 105 07/15/2022   CREATININE 0.87 07/15/2022   BUN 12 07/15/2022   CO2 25 07/15/2022   TSH 2.05 06/19/2022   HGBA1C 5.3 06/16/2015    BP Readings from Last 3 Encounters:  11/12/23 (!) 116/90  10/17/23 112/60  10/08/23 (!) 139/95    Labupdate  discussed reviewed with patient  EKG  Nacute findings sinus rr' v1 wnl copy to pat  ASSESSMENT AND PLAN:  Discussed the following assessment and plan:    ICD-10-CM   1. Welcome to Medicare preventive visit  Z00.00 EKG 12-Lead    CBC with Differential/Platelet    Comprehensive metabolic panel    Lipid panel    TSH    Hemoglobin A1c    2. Estrogen deficiency  E28.39 DG Bone Density   dexa ordered    3. Primary osteoarthritis involving multiple joints  M15.0 CBC with Differential/Platelet    Comprehensive metabolic panel    Lipid panel    TSH    Hemoglobin A1c    4. Encounter for Medicare annual wellness exam  Z00.00     5. Decreased glomerular filtration rate  R94.4 CBC with Differential/Platelet    Comprehensive metabolic panel    Lipid panel    TSH    Hemoglobin A1c    6. Thyroiditis, unspecified history of  E06.9 CBC with Differential/Platelet    Comprehensive metabolic panel    Lipid panel    TSH    Hemoglobin A1c    7. Medication management  Z79.899 CBC with Differential/Platelet    Comprehensive metabolic panel    Lipid panel    TSH    Hemoglobin A1c    M forOST m  interested but does have advanced directive.  And HCPOA Lab today EKG  baseline included with welcome visit  Bring back form and then can review and place in record as wish.  Lab update  Dexa scan ordered   Return for yearly as indicated .  Patient Care Team: Madelin Headings, MD as PCP - General Patient Instructions  Good to see you today  Continue lifestyle intervention healthy eating and exercise .   Review   then fill the MOST form and then get back with  me and we can go over and sign   and copy to record . Consider dexa scan baseline      Burna Mortimer K. Zaylee Cornia M.D.

## 2023-11-12 NOTE — Progress Notes (Signed)
Subjective:    Christina Wilkins is a 67 y.o. female who presents for a Welcome to Medicare exam.   Cardiac Risk Factors include: none      Objective:    Today's Vitals   11/12/23 0830  BP: (!) 118/90  Pulse: 78  Temp: 97.9 F (36.6 C)  TempSrc: Oral  SpO2: 98%  Weight: 141 lb 6.4 oz (64.1 kg)  Height: 5' 6.5" (1.689 m)  PainSc: 4   Body mass index is 22.48 kg/m.  Medications Outpatient Encounter Medications as of 11/12/2023  Medication Sig   diclofenac Sodium (VOLTAREN) 1 % GEL Apply topically 4 (four) times daily.   fluorouracil (EFUDEX) 5 % cream as needed.   Ginger, Zingiber officinalis, (GINGER PO) Take by mouth.   loratadine (CLARITIN) 10 MG tablet Take 10 mg by mouth daily as needed.   Misc Natural Products (TART CHERRY ADVANCED PO) Take by mouth.   Multiple Vitamin (MULTIVITAMIN) tablet Take 1 tablet by mouth daily.   tretinoin (RETIN-A) 0.025 % cream    TURMERIC PO Take 1 capsule by mouth daily.   zolpidem (AMBIEN) 5 MG tablet Take 1 tablet (5 mg total) by mouth at bedtime as needed. for sleep travel   chlorpheniramine-HYDROcodone (TUSSIONEX PENNKINETIC ER) 10-8 MG/5ML Take 5 mLs by mouth every 12 (twelve) hours as needed for cough. (Patient not taking: Reported on 11/12/2023)   promethazine-codeine (PHENERGAN WITH CODEINE) 6.25-10 MG/5ML syrup Take 5 mLs by mouth every 6 (six) hours as needed for cough. (Patient not taking: Reported on 11/12/2023)   [DISCONTINUED] amoxicillin (AMOXIL) 500 MG capsule Take 1 capsule (500 mg total) by mouth 3 (three) times daily.   [DISCONTINUED] Glucosamine-Chondroitin 750-600 MG TABS Take 1 tablet by mouth daily.   [DISCONTINUED] predniSONE (DELTASONE) 20 MG tablet Take two tablets by mouth once daily for 5 days.   No facility-administered encounter medications on file as of 11/12/2023.     History: Past Medical History:  Diagnosis Date   Allergic rhinitis    seasonal summer cough takes claritin   Varicella     Past Surgical History:  Procedure Laterality Date   BUNIONECTOMY     right eye scar tissue on cornea removed  April 2011    Family History  Problem Relation Age of Onset   Breast cancer Mother 91   Hypothyroidism Mother    Arthritis Mother    Alcohol abuse Father    Hypertension Father    Allergies Sister    Alcohol abuse Brother    Social History   Occupational History   Not on file  Tobacco Use   Smoking status: Never   Smokeless tobacco: Never  Vaping Use   Vaping status: Never Used  Substance and Sexual Activity   Alcohol use: Yes    Comment: per pt a beer once a week    Drug use: Never   Sexual activity: Not on file    Tobacco Counseling Counseling given: Not Answered   Immunizations and Health Maintenance Immunization History  Administered Date(s) Administered   Influenza Split 10/18/2011, 09/30/2012   Influenza Whole 10/12/2007, 09/27/2008, 10/16/2009, 08/29/2010   Influenza,inj,Quad PF,6+ Mos 09/20/2013, 09/02/2014, 08/21/2015, 10/07/2016, 09/08/2017, 10/02/2018, 09/16/2019, 08/16/2020, 09/17/2022   Influenza,inj,Quad PF,6-35 Mos 08/21/2021   PFIZER(Purple Top)SARS-COV-2 Vaccination 02/03/2020, 02/24/2020   PPD Test 03/24/2013   Td 12/23/2006   Tdap 02/19/2019   Zoster Recombinant(Shingrix) 01/22/2019, 05/07/2019   Health Maintenance Due  Topic Date Due   DEXA SCAN  Never done    Activities  of Daily Living    11/12/2023    8:36 AM  In your present state of health, do you have any difficulty performing the following activities:  Hearing? 0  Vision? 0  Difficulty concentrating or making decisions? 0  Walking or climbing stairs? 0  Dressing or bathing? 0  Doing errands, shopping? 0  Preparing Food and eating ? N  Using the Toilet? N  In the past six months, have you accidently leaked urine? Y  Comment when cough or sneeze sometimes.  Do you have problems with loss of bowel control? N  Managing your Medications? N  Managing your Finances?  N  Housekeeping or managing your Housekeeping? N    Physical Exam   Physical Exam (optional), or other factors deemed appropriate based on the beneficiary's medical and social history and current clinical standards.   Advanced Directives: Does Patient Have a Medical Advance Directive?: Yes Type of Advance Directive: Healthcare Power of Attorney, Living will Copy of Healthcare Power of Attorney in Chart?: No - copy requested  EKG:  normal EKG, normal sinus rhythm      Assessment:    This is a routine wellness examination for this patient .   Vision/Hearing screen patient not thinking is accurate  Hearing Screening   500Hz  1000Hz  2000Hz  4000Hz   Right ear Fail Pass Pass Fail  Left ear Pass Pass Pass Pass   Vision Screening   Right eye Left eye Both eyes  Without correction     With correction 20/32 20/16-1 20/20     Goals   None    Depression Screen    11/12/2023   10:38 AM 10/17/2023    8:55 AM 06/19/2022    2:24 PM 03/15/2022    2:59 PM  PHQ 2/9 Scores  PHQ - 2 Score 3 0 0 0  PHQ- 9 Score 4  2 2      Fall Risk    11/12/2023    8:44 AM  Fall Risk   Falls in the past year? 0  Number falls in past yr: 0  Injury with Fall? 0  Risk for fall due to : No Fall Risks  Follow up Falls evaluation completed    Cognitive Function:        11/12/2023   10:39 AM  6CIT Screen  What Year? 0 points  What month? 0 points  What time? 0 points  Count back from 20 0 points  Months in reverse 0 points  Repeat phrase 0 points  Total Score 0 points    Patient Care Team: Madelin Headings, MD as PCP - General     Plan:   See above   I have personally reviewed and noted the following in the patient's chart:   Medical and social history Use of alcohol, tobacco or illicit drugs  Current medications and supplements Functional ability and status Nutritional status Physical activity Advanced directives List of other physicians Hospitalizations, surgeries, and  ER visits in previous 12 months Vitals Screenings to include cognitive, depression, and falls Referrals and appointments  In addition, I have reviewed and discussed with patient certain preventive protocols, quality metrics, and best practice recommendations. A written personalized care plan for preventive services as well as general preventive health recommendations were provided to patient.     Vickii Chafe, CMA 11/12/2023

## 2023-11-12 NOTE — Progress Notes (Signed)
Results stable or normal  favorable .     any out of range  seems clinically insignificant   gfr is in  normal range

## 2023-11-19 NOTE — Telephone Encounter (Signed)
So the hdl is so high in the profile  that is a favorable reading . Ldl is 2 points from great .  I am not concerned   . If you want more info of risk  we can order a self pay  99$ ct calcium score  to look at calcium build up that correlates with future vascular event risk .

## 2024-02-11 ENCOUNTER — Encounter: Payer: Self-pay | Admitting: Internal Medicine

## 2024-02-13 MED ORDER — AZITHROMYCIN 500 MG PO TABS: ORAL_TABLET | ORAL | 0 refills | Status: DC

## 2024-02-13 NOTE — Telephone Encounter (Signed)
 So  I use azithromycin as first line for TD  resistance and se profile of cipro .  I will send in azithromycin  to use  as needed for Traveler diarrhea  ( usually  treat supportive  therapry is enough)

## 2024-02-16 ENCOUNTER — Inpatient Hospital Stay: Admission: RE | Admit: 2024-02-16 | Payer: Medicare Other | Source: Ambulatory Visit

## 2024-02-17 ENCOUNTER — Ambulatory Visit (INDEPENDENT_AMBULATORY_CARE_PROVIDER_SITE_OTHER)
Admission: RE | Admit: 2024-02-17 | Discharge: 2024-02-17 | Disposition: A | Discharge status: Home / Self Care | Payer: Medicare Other | Source: Ambulatory Visit | Attending: Internal Medicine | Admitting: Internal Medicine

## 2024-02-17 DIAGNOSIS — E2839 Other primary ovarian failure: Secondary | ICD-10-CM

## 2024-02-18 ENCOUNTER — Other Ambulatory Visit: Payer: Medicare Other

## 2024-02-23 ENCOUNTER — Encounter: Payer: Self-pay | Admitting: Internal Medicine

## 2024-02-23 NOTE — Progress Notes (Signed)
 Dexa t score -1.1 mild  decrease in bone density    Optimize weight bearing exercise  and resistance training  and can repeat dexa in  3-4 years to  ensure stability

## 2024-02-25 ENCOUNTER — Encounter: Payer: Self-pay | Admitting: Family Medicine

## 2024-02-25 ENCOUNTER — Ambulatory Visit (INDEPENDENT_AMBULATORY_CARE_PROVIDER_SITE_OTHER): Payer: Medicare Other | Admitting: Family Medicine

## 2024-02-25 VITALS — BP 116/78 | HR 75 | Temp 98.7°F | Ht 66.5 in | Wt 142.2 lb

## 2024-02-25 DIAGNOSIS — R590 Localized enlarged lymph nodes: Secondary | ICD-10-CM

## 2024-02-25 NOTE — Progress Notes (Signed)
 Established Patient Office Visit   Subjective  Patient ID: Christina Wilkins, female    DOB: [birth year]  Age: 68 y.o. MRN: [id number]  Chief Complaint  Patient presents with   Mass    Soft tissue mass left side of the neck,  1 week, patient denies any pain     Patient is a 68 year old female followed by Dr. Fabian Sharp and seen for acute concern.  Patient endorses her massage therapist feeling a mass on the left side of her neck last week.  Area has decreased in size some since visit with massage therapist.  Prior to appointment pt mentions seeing sick around February 14 after travel to [place].  Pt had myalgias x 1 day and felt like she was developing the flu ,however myalgias resolved and she had lethargy x 2 weeks.  Neck is nontender.  Patient had not even noticed the area.    Patient Active Problem List   Diagnosis Date Noted   Suzette Battiest disease (radial styloid tenosynovitis) 02/21/2023   Pain in right finger(s) 10/24/2022   Pain in left shoulder 03/07/2021   Pain in right ankle and joints of right foot 03/07/2021   Chronic meniscal tear of knee 09/07/2020   Arthritis of right knee 09/07/2020   Subluxation of tendon of long head of biceps 02/25/2018   Cyst of knee joint, right 02/25/2018   Paresthesia of both hands 06/16/2015   Menopausal and perimenopausal disorder 04/06/2013   Medication management 04/06/2013   H/O sleep disturbance 04/12/2012   THYROIDITIS 09/14/2010   MENOPAUSAL SYNDROME 09/14/2010   THYROID STIMULATING HORMONE, ABNORMAL 08/29/2010   ALLERGIC RHINITIS 08/29/2010   Past Medical History:  Diagnosis Date   Allergic rhinitis    seasonal summer cough takes claritin   Varicella    Past Surgical History:  Procedure Laterality Date   BUNIONECTOMY     right eye scar tissue on cornea removed  April 2011   Social History   Tobacco Use   Smoking status: Never   Smokeless tobacco: Never  Vaping Use   Vaping status: Never Used  Substance Use Topics    Alcohol use: Yes    Comment: per pt a beer once a week    Drug use: Never   Family History  Problem Relation Age of Onset   Breast cancer Mother 51   Hypothyroidism Mother    Arthritis Mother    Alcohol abuse Father    Hypertension Father    Allergies Sister    Alcohol abuse Brother    No Known Allergies    ROS Negative unless stated above    Objective:     BP 116/78 (BP Location: Left Arm, Patient Position: Sitting, Cuff Size: Normal)   Pulse 75   Temp 98.7 F (37.1 C) (Oral)   Ht 5' 6.5" (1.689 m)   Wt 142 lb 3.2 oz (64.5 kg)   SpO2 97%   BMI 22.61 kg/m  BP Readings from Last 3 Encounters:  02/25/24 116/78  11/12/23 (!) 116/90  10/17/23 112/60   Wt Readings from Last 3 Encounters:  02/25/24 142 lb 3.2 oz (64.5 kg)  11/12/23 141 lb 6.4 oz (64.1 kg)  10/17/23 142 lb 11.2 oz (64.7 kg)      Physical Exam Constitutional:      Appearance: Normal appearance.  HENT:     Head: Normocephalic and atraumatic.     Nose: Nose normal.     Mouth/Throat:     Mouth: Mucous membranes are moist.  Eyes:     Extraocular Movements: Extraocular movements intact.     Conjunctiva/sclera: Conjunctivae normal.     Pupils: Pupils are equal, round, and reactive to light.  Neck:      Comments: A 1.5 cm round mobile enlarged lymph node of L lateral neck. Nontender.  No preauricular, submandibular, posterior occipital lymph nodes appreciated. Cardiovascular:     Rate and Rhythm: Normal rate.  Pulmonary:     Effort: Pulmonary effort is normal.  Musculoskeletal:     Cervical back: No tenderness.  Lymphadenopathy:     Cervical: Cervical adenopathy present.     Right cervical: No superficial, deep or posterior cervical adenopathy.    Left cervical: Superficial cervical adenopathy present. No deep or posterior cervical adenopathy.  Skin:    General: Skin is warm and dry.  Neurological:     Mental Status: She is alert and oriented to person, place, and time.      No  results found for any visits on 02/25/24.    Assessment & Plan:  Lymphadenopathy of left cervical region -     CBC with Differential/Platelet; Future  Pt with presumed left-sided cervical lymphadenopathy, decreasing in size.  Likely 2/2 recent illness.  Discussed monitoring area versus imaging and labs.  Patient wishes to wait.  Will have patient wait 1-2 weeks to see if area continues to improve.  If area remains pt will set up a lab appointment to have his CBC checked.  Can also proceed with ultrasound at that time.  Given precautions.  Follow-up with PCP as needed.  Given handout.  Return if symptoms worsen or fail to improve.   Deeann Saint, MD

## 2024-02-25 NOTE — Patient Instructions (Signed)
 If you are still feeling the enlarged lymph node/ it has not continued to decrease in size over the next 1-2 wks schedule a lab visit to have your CBC checked.  We may also need to order an ultrasound of your neck at that time.

## 2024-05-17 ENCOUNTER — Other Ambulatory Visit: Payer: Self-pay | Admitting: Medical Genetics

## 2024-05-19 ENCOUNTER — Other Ambulatory Visit: Payer: Self-pay | Admitting: Internal Medicine

## 2024-05-19 DIAGNOSIS — Z1231 Encounter for screening mammogram for malignant neoplasm of breast: Secondary | ICD-10-CM

## 2024-05-20 ENCOUNTER — Other Ambulatory Visit

## 2024-05-20 DIAGNOSIS — Z006 Encounter for examination for normal comparison and control in clinical research program: Secondary | ICD-10-CM

## 2024-05-24 ENCOUNTER — Encounter (HOSPITAL_BASED_OUTPATIENT_CLINIC_OR_DEPARTMENT_OTHER): Payer: Self-pay | Admitting: Radiology

## 2024-05-24 ENCOUNTER — Ambulatory Visit (HOSPITAL_BASED_OUTPATIENT_CLINIC_OR_DEPARTMENT_OTHER)
Admission: RE | Admit: 2024-05-24 | Discharge: 2024-05-24 | Disposition: A | Discharge status: Home / Self Care | Source: Ambulatory Visit

## 2024-05-24 DIAGNOSIS — Z1231 Encounter for screening mammogram for malignant neoplasm of breast: Secondary | ICD-10-CM | POA: Insufficient documentation

## 2024-06-09 LAB — GENECONNECT MOLECULAR SCREEN: Genetic Analysis Overall Interpretation: NEGATIVE

## 2024-07-09 ENCOUNTER — Encounter: Payer: Self-pay | Admitting: Advanced Practice Midwife

## 2024-08-03 ENCOUNTER — Encounter: Payer: Self-pay | Admitting: Internal Medicine

## 2024-08-03 NOTE — Telephone Encounter (Signed)
 I use azithromycin  if needed  for travelers diarrhea   Need to know  how may days of travel to order  malaria prophylaxis  we can do  doxy if you tolerate ok

## 2024-08-04 MED ORDER — DOXYCYCLINE HYCLATE 100 MG PO TABS: ORAL_TABLET | ORAL | 0 refills | Status: AC

## 2024-08-04 MED ORDER — AZITHROMYCIN 500 MG PO TABS: ORAL_TABLET | ORAL | 0 refills | Status: AC

## 2024-09-08 ENCOUNTER — Other Ambulatory Visit: Payer: Self-pay | Admitting: Internal Medicine

## 2024-11-04 ENCOUNTER — Ambulatory Visit (INDEPENDENT_AMBULATORY_CARE_PROVIDER_SITE_OTHER): Admitting: Internal Medicine

## 2024-11-04 ENCOUNTER — Encounter: Payer: Self-pay | Admitting: Internal Medicine

## 2024-11-04 VITALS — BP 114/84 | HR 88 | Temp 98.0°F | Ht 66.5 in | Wt 149.0 lb

## 2024-11-04 DIAGNOSIS — F4329 Adjustment disorder with other symptoms: Secondary | ICD-10-CM

## 2024-11-04 DIAGNOSIS — Z79899 Other long term (current) drug therapy: Secondary | ICD-10-CM

## 2024-11-04 MED ORDER — ZOLPIDEM TARTRATE 5 MG PO TABS: 5.0000 mg | ORAL_TABLET | Freq: Every evening | ORAL | 2 refills | Status: AC | PRN

## 2024-11-04 MED ORDER — CITALOPRAM HYDROBROMIDE 20 MG PO TABS: 20.0000 mg | ORAL_TABLET | Freq: Every day | ORAL | 1 refills | Status: AC

## 2024-11-04 MED ORDER — PROMETHAZINE-CODEINE 6.25-10 MG/5ML PO SYRP: 5.0000 mL | ORAL_SOLUTION | Freq: Four times a day (QID) | ORAL | 0 refills | Status: AC | PRN

## 2024-11-04 NOTE — Patient Instructions (Addendum)
 Good to see you . Get a lab  appt  after  on citalopram  for  4-6 weeks .

## 2024-11-04 NOTE — Progress Notes (Signed)
 Chief Complaint  Patient presents with   Medical Management of Chronic Issues   Anxiety    Pt reports family situation-daughter coming home. To discuss citalopram  Rx.     HPI: Christina Wilkins 68 y.o. come in for Chronic  management  Generally well.  Stress anxiety flares  up again since 2 adult children have come home  after life difficulties .  Tried left over citalopram   situation 2017  had helped    asked about restarting .  Hand arthritis : tolerating  avoiding nsaids etc to optimize renal function Refills cough med  and ambien  if needed she uses with travel or if illness  over the year .  Planning mission trip to [place]  and will poss get yellow fever vaccine.    ROS: See pertinent positives and negatives per HPI. She is reotred pharmacist and aware .  Med risk benefit s  Past Medical History:  Diagnosis Date   Allergic rhinitis    seasonal summer cough takes claritin   Varicella     Family History  Problem Relation Age of Onset   Breast cancer Mother 43   Hypothyroidism Mother    Arthritis Mother    Alcohol abuse Father    Hypertension Father    Allergies Sister    Alcohol abuse Brother     Social History   Socioeconomic History   Marital status: Single    Spouse name: Not on file   Number of children: Not on file   Years of education: Not on file   Highest education level: Not on file  Occupational History   Not on file  Tobacco Use   Smoking status: Never   Smokeless tobacco: Never  Vaping Use   Vaping status: Never Used  Substance and Sexual Activity   Alcohol use: Yes    Comment: per pt a beer once a week    Drug use: Never   Sexual activity: Not on file  Other Topics Concern   Not on file  Social History Narrative   Single pharmacist   Regular Exercise- yes   hh of 3   Two adopted children    Social Drivers of Health   Financial Resource Strain: Low Risk  (11/12/2023)   Overall Financial Resource Strain (CARDIA)    Difficulty  of Paying Living Expenses: Not hard at all  Food Insecurity: No Food Insecurity (11/12/2023)   Hunger Vital Sign    Worried About Running Out of Food in the Last Year: Never true    Ran Out of Food in the Last Year: Never true  Transportation Needs: No Transportation Needs (11/12/2023)   PRAPARE - Administrator, Civil Service (Medical): No    Lack of Transportation (Non-Medical): No  Physical Activity: Sufficiently Active (11/12/2023)   Exercise Vital Sign    Days of Exercise per Week: 3 days    Minutes of Exercise per Session: 150+ min  Stress: Stress Concern Present (11/12/2023)   [employer] of Occupational Health - Occupational Stress Questionnaire    Feeling of Stress : Very much  Social Connections: Moderately Integrated (11/12/2023)   Social Connection and Isolation Panel    Frequency of Communication with Friends and Family: Three times a week    Frequency of Social Gatherings with Friends and Family: Twice a week    Attends Religious Services: More than 4 times per year    Active Member of [employer] or Organizations: Yes    Attends Club or  Organization Meetings: More than 4 times per year    Marital Status: Never married    Outpatient Medications Prior to Visit  Medication Sig Dispense Refill   promethazine -codeine  (PHENERGAN  WITH CODEINE ) 6.25-10 MG/5ML syrup Take 5 mLs by mouth every 6 (six) hours as needed for cough. 180 mL 0   zolpidem  (AMBIEN ) 5 MG tablet Take 1 tablet (5 mg total) by mouth at bedtime as needed. for sleep travel 20 tablet 2   azithromycin  (ZITHROMAX ) 500 MG tablet One po  qd for 1-3 days if needed for moderate to severe travelers diarrhea 3 tablet 0   chlorpheniramine-HYDROcodone (TUSSIONEX PENNKINETIC ER) 10-8 MG/5ML Take 5 mLs by mouth every 12 (twelve) hours as needed for cough. 115 mL 0   diclofenac Sodium (VOLTAREN) 1 % GEL Apply topically 4 (four) times daily.     doxycycline  (VIBRA -TABS) 100 MG tablet One by mouth daily beginning  1-2 days pre travel continuing 4 weeks after returning for malaria prophylaxis 38 tablet 0   fluorouracil (EFUDEX) 5 % cream as needed.     Ginger, Zingiber officinalis, (GINGER PO) Take by mouth.     loratadine (CLARITIN) 10 MG tablet Take 10 mg by mouth daily as needed.     Misc Natural Products (TART CHERRY ADVANCED PO) Take by mouth.     Multiple Vitamin (MULTIVITAMIN) tablet Take 1 tablet by mouth daily.     tretinoin (RETIN-A) 0.025 % cream      TURMERIC PO Take 1 capsule by mouth daily.     No facility-administered medications prior to visit.     EXAM:  BP 114/84 (BP Location: Left Arm, Patient Position: Sitting, Cuff Size: Normal)   Pulse 88   Temp 98 F (36.7 C) (Oral)   Ht 5' 6.5 (1.689 m)   Wt 149 lb (67.6 kg)   SpO2 98%   BMI 23.69 kg/m   Body mass index is 23.69 kg/m.  GENERAL: vitals reviewed and listed above, alert, oriented, appears well hydrated and in no acute distress HEENT: atraumatic, conjunctiva  clear, no obvious abnormalities on inspection of external nose and ears tmc clear  small amt wax left eac .  NECK: no obvious masses on inspection palpation  LUNGS: clear to auscultation bilaterally, no wheezes, rales or rhonchi, good air movement CV: HRRR, no clubbing cyanosis or  peripheral edema nl cap refill  MS: moves all extremities without noticeable focal  abnormality  oa changes hands  PSYCH: pleasant and cooperative, no obvious depression or anxiety Lab Results  Component Value Date   WBC 4.9 11/12/2023   HGB 14.8 11/12/2023   HCT 45.5 11/12/2023   PLT 223.0 11/12/2023   GLUCOSE 91 11/12/2023   CHOL 218 (H) 11/12/2023   TRIG 61.0 11/12/2023   HDL 102.10 11/12/2023   LDLCALC 104 (H) 11/12/2023   ALT 12 11/12/2023   AST 18 11/12/2023   NA 138 11/12/2023   K 4.3 11/12/2023   CL 104 11/12/2023   CREATININE 0.88 11/12/2023   BUN 17 11/12/2023   CO2 28 11/12/2023   TSH 1.73 11/12/2023   HGBA1C 5.6 11/12/2023   BP Readings from Last 3  Encounters:  11/04/24 114/84  02/25/24 116/78  11/12/23 (!) 116/90    ASSESSMENT AND PLAN:  Discussed the following assessment and plan:  Medication management - Plan: Basic metabolic panel with GFR  Stress and adjustment reaction Disc benefit more than risk  for meds    Restart ( from 2017) citalopram  10 and inc to 20  as in past .  Plan to send in message in 4-6 weeks about progress and  get lab BMP order placed .   -Patient advised to return or notify health care team  if  new concerns arise.  Patient Instructions  Good to see you . Get a lab  appt  after  on citalopram  for  4-6 weeks .  Cono Gebhard K. Neco Kling M.D.

## 2024-11-08 ENCOUNTER — Other Ambulatory Visit: Payer: Self-pay | Admitting: Internal Medicine

## 2024-11-09 ENCOUNTER — Ambulatory Visit: Admitting: Family Medicine

## 2024-11-09 ENCOUNTER — Encounter: Payer: Self-pay | Admitting: Internal Medicine

## 2024-11-10 MED ORDER — HYDROCOD POLI-CHLORPHE POLI ER 10-8 MG/5ML PO SUER: 5.0000 mL | Freq: Two times a day (BID) | ORAL | 0 refills | Status: AC | PRN

## 2024-11-10 NOTE — Telephone Encounter (Signed)
 I sent in tussionex  caution with sedation . May try 1/2 dose if needed   I dont order a lot of narcotic cough meds so I didn't know either!

## 2025-02-14 ENCOUNTER — Ambulatory Visit

## 2025-02-25 ENCOUNTER — Other Ambulatory Visit

## 2025-02-25 ENCOUNTER — Ambulatory Visit
